# Patient Record
Sex: Female | Born: 1965
Health system: Southern US, Community
[De-identification: ages and names within clinical notes are randomized; demographics above are authoritative.]

## PROBLEM LIST (undated history)

## (undated) DIAGNOSIS — M5020 Other cervical disc displacement, unspecified cervical region: Secondary | ICD-10-CM

## (undated) DIAGNOSIS — I10 Essential (primary) hypertension: Secondary | ICD-10-CM

## (undated) DIAGNOSIS — C50919 Malignant neoplasm of unspecified site of unspecified female breast: Secondary | ICD-10-CM

## (undated) DIAGNOSIS — M797 Fibromyalgia: Secondary | ICD-10-CM

## (undated) DIAGNOSIS — C801 Malignant (primary) neoplasm, unspecified: Secondary | ICD-10-CM

## (undated) DIAGNOSIS — G4700 Insomnia, unspecified: Secondary | ICD-10-CM

## (undated) HISTORY — PX: CERVICAL DISC SURGERY: SHX588

## (undated) HISTORY — DX: Insomnia, unspecified: G47.00

## (undated) HISTORY — DX: Fibromyalgia: M79.7

## (undated) HISTORY — PX: SALPINGOOPHORECTOMY: SHX82

## (undated) HISTORY — DX: Other cervical disc displacement, unspecified cervical region: M50.20

## (undated) HISTORY — DX: Malignant neoplasm of unspecified site of unspecified female breast: C50.919

## (undated) HISTORY — DX: Essential (primary) hypertension: I10

## (undated) HISTORY — DX: Malignant (primary) neoplasm, unspecified: C80.1

## (undated) HISTORY — PX: PORT-A-CATH REMOVAL: SHX5289

---

## 1996-05-15 HISTORY — PX: KNEE ARTHROSCOPY: SUR90

## 1997-08-26 ENCOUNTER — Ambulatory Visit (HOSPITAL_COMMUNITY): Admission: RE | Admit: 1997-08-26 | Discharge: 1997-08-26 | Payer: Self-pay | Admitting: Urology

## 1998-07-22 ENCOUNTER — Ambulatory Visit (HOSPITAL_COMMUNITY): Admission: RE | Admit: 1998-07-22 | Discharge: 1998-07-22 | Payer: Self-pay | Admitting: Urology

## 1998-07-22 ENCOUNTER — Encounter: Payer: Self-pay | Admitting: Urology

## 1998-08-19 ENCOUNTER — Ambulatory Visit (HOSPITAL_COMMUNITY): Admission: RE | Admit: 1998-08-19 | Discharge: 1998-08-19 | Payer: Self-pay | Admitting: Urology

## 1998-08-19 ENCOUNTER — Encounter: Payer: Self-pay | Admitting: Urology

## 2000-10-02 ENCOUNTER — Other Ambulatory Visit: Admission: RE | Admit: 2000-10-02 | Discharge: 2000-10-02 | Payer: Self-pay | Admitting: Obstetrics and Gynecology

## 2001-12-18 ENCOUNTER — Other Ambulatory Visit: Admission: RE | Admit: 2001-12-18 | Discharge: 2001-12-18 | Payer: Self-pay | Admitting: Obstetrics and Gynecology

## 2002-05-09 ENCOUNTER — Emergency Department (HOSPITAL_COMMUNITY): Admission: EM | Admit: 2002-05-09 | Discharge: 2002-05-09 | Payer: Self-pay | Admitting: Emergency Medicine

## 2002-05-09 ENCOUNTER — Encounter: Payer: Self-pay | Admitting: Emergency Medicine

## 2002-05-16 ENCOUNTER — Ambulatory Visit (HOSPITAL_BASED_OUTPATIENT_CLINIC_OR_DEPARTMENT_OTHER): Admission: RE | Admit: 2002-05-16 | Discharge: 2002-05-16 | Payer: Self-pay | Admitting: Urology

## 2002-05-16 ENCOUNTER — Encounter: Payer: Self-pay | Admitting: Urology

## 2002-07-25 ENCOUNTER — Ambulatory Visit (HOSPITAL_COMMUNITY): Admission: RE | Admit: 2002-07-25 | Discharge: 2002-07-25 | Payer: Self-pay | Admitting: Chiropractic Medicine

## 2002-07-25 ENCOUNTER — Encounter: Payer: Self-pay | Admitting: Chiropractic Medicine

## 2003-01-02 ENCOUNTER — Encounter: Admission: RE | Admit: 2003-01-02 | Discharge: 2003-01-02 | Payer: Self-pay | Admitting: Neurosurgery

## 2003-01-02 ENCOUNTER — Encounter: Payer: Self-pay | Admitting: Neurosurgery

## 2003-01-02 ENCOUNTER — Encounter: Payer: Self-pay | Admitting: Radiology

## 2003-01-08 ENCOUNTER — Other Ambulatory Visit: Admission: RE | Admit: 2003-01-08 | Discharge: 2003-01-08 | Payer: Self-pay | Admitting: Obstetrics and Gynecology

## 2003-01-15 ENCOUNTER — Encounter: Admission: RE | Admit: 2003-01-15 | Discharge: 2003-01-15 | Payer: Self-pay | Admitting: Neurosurgery

## 2003-01-15 ENCOUNTER — Encounter: Payer: Self-pay | Admitting: Neurosurgery

## 2003-03-20 ENCOUNTER — Ambulatory Visit (HOSPITAL_COMMUNITY): Admission: RE | Admit: 2003-03-20 | Discharge: 2003-03-21 | Payer: Self-pay | Admitting: Neurosurgery

## 2004-01-19 ENCOUNTER — Other Ambulatory Visit: Admission: RE | Admit: 2004-01-19 | Discharge: 2004-01-19 | Payer: Self-pay | Admitting: Obstetrics and Gynecology

## 2004-02-12 ENCOUNTER — Ambulatory Visit (HOSPITAL_COMMUNITY): Admission: RE | Admit: 2004-02-12 | Discharge: 2004-02-12 | Payer: Self-pay | Admitting: Obstetrics and Gynecology

## 2004-09-07 ENCOUNTER — Other Ambulatory Visit: Admission: RE | Admit: 2004-09-07 | Discharge: 2004-09-07 | Payer: Self-pay | Admitting: Obstetrics and Gynecology

## 2004-10-12 ENCOUNTER — Ambulatory Visit (HOSPITAL_COMMUNITY): Admission: RE | Admit: 2004-10-12 | Discharge: 2004-10-12 | Payer: Self-pay | Admitting: Neurosurgery

## 2005-02-01 ENCOUNTER — Other Ambulatory Visit: Admission: RE | Admit: 2005-02-01 | Discharge: 2005-02-01 | Payer: Self-pay | Admitting: Obstetrics and Gynecology

## 2005-12-13 DIAGNOSIS — C50919 Malignant neoplasm of unspecified site of unspecified female breast: Secondary | ICD-10-CM

## 2005-12-13 HISTORY — DX: Malignant neoplasm of unspecified site of unspecified female breast: C50.919

## 2005-12-25 ENCOUNTER — Encounter (INDEPENDENT_AMBULATORY_CARE_PROVIDER_SITE_OTHER): Payer: Self-pay | Admitting: *Deleted

## 2005-12-25 ENCOUNTER — Encounter: Admission: RE | Admit: 2005-12-25 | Discharge: 2005-12-25 | Payer: Self-pay | Admitting: Obstetrics and Gynecology

## 2005-12-25 ENCOUNTER — Encounter (INDEPENDENT_AMBULATORY_CARE_PROVIDER_SITE_OTHER): Payer: Self-pay | Admitting: Diagnostic Radiology

## 2006-01-04 ENCOUNTER — Encounter: Admission: RE | Admit: 2006-01-04 | Discharge: 2006-01-04 | Payer: Self-pay | Admitting: Surgery

## 2006-02-01 ENCOUNTER — Encounter: Admission: RE | Admit: 2006-02-01 | Discharge: 2006-02-01 | Payer: Self-pay | Admitting: Surgery

## 2006-02-02 ENCOUNTER — Observation Stay (HOSPITAL_COMMUNITY): Admission: AD | Admit: 2006-02-02 | Discharge: 2006-02-03 | Payer: Self-pay | Admitting: Surgery

## 2006-02-02 ENCOUNTER — Encounter (INDEPENDENT_AMBULATORY_CARE_PROVIDER_SITE_OTHER): Payer: Self-pay | Admitting: *Deleted

## 2006-02-02 ENCOUNTER — Encounter: Payer: Self-pay | Admitting: Surgery

## 2006-02-02 HISTORY — PX: MASTECTOMY: SHX3

## 2006-02-15 ENCOUNTER — Ambulatory Visit: Payer: Self-pay | Admitting: Oncology

## 2006-02-19 ENCOUNTER — Inpatient Hospital Stay (HOSPITAL_COMMUNITY): Admission: AD | Admit: 2006-02-19 | Discharge: 2006-02-20 | Payer: Self-pay | Admitting: Surgery

## 2006-03-05 ENCOUNTER — Encounter: Admission: RE | Admit: 2006-03-05 | Discharge: 2006-03-05 | Payer: Self-pay | Admitting: Surgery

## 2006-03-06 ENCOUNTER — Encounter: Admission: RE | Admit: 2006-03-06 | Discharge: 2006-03-13 | Payer: Self-pay | Admitting: Surgery

## 2006-03-07 LAB — CBC WITH DIFFERENTIAL/PLATELET
Basophils Absolute: 0 10*3/uL (ref 0.0–0.1)
EOS%: 3.3 % (ref 0.0–7.0)
HCT: 37.9 % (ref 34.8–46.6)
HGB: 12.9 g/dL (ref 11.6–15.9)
LYMPH%: 24.3 % (ref 14.0–48.0)
MCH: 29 pg (ref 26.0–34.0)
MCV: 85.3 fL (ref 81.0–101.0)
NEUT%: 64.1 % (ref 39.6–76.8)
Platelets: 374 10*3/uL (ref 145–400)
lymph#: 2.3 10*3/uL (ref 0.9–3.3)

## 2006-03-07 LAB — COMPREHENSIVE METABOLIC PANEL
Albumin: 4.5 g/dL (ref 3.5–5.2)
Alkaline Phosphatase: 90 U/L (ref 39–117)
BUN: 13 mg/dL (ref 6–23)
Calcium: 9.7 mg/dL (ref 8.4–10.5)
Glucose, Bld: 83 mg/dL (ref 70–99)
Potassium: 4.7 mEq/L (ref 3.5–5.3)

## 2006-03-13 ENCOUNTER — Ambulatory Visit (HOSPITAL_COMMUNITY): Admission: RE | Admit: 2006-03-13 | Discharge: 2006-03-13 | Payer: Self-pay | Admitting: Oncology

## 2006-03-27 ENCOUNTER — Ambulatory Visit (HOSPITAL_COMMUNITY): Admission: RE | Admit: 2006-03-27 | Discharge: 2006-03-27 | Payer: Self-pay | Admitting: Surgery

## 2006-03-27 ENCOUNTER — Encounter (INDEPENDENT_AMBULATORY_CARE_PROVIDER_SITE_OTHER): Payer: Self-pay | Admitting: *Deleted

## 2006-03-28 ENCOUNTER — Ambulatory Visit (HOSPITAL_COMMUNITY): Admission: RE | Admit: 2006-03-28 | Discharge: 2006-03-28 | Payer: Self-pay | Admitting: Oncology

## 2006-04-02 ENCOUNTER — Ambulatory Visit: Payer: Self-pay | Admitting: Oncology

## 2006-04-30 ENCOUNTER — Encounter (INDEPENDENT_AMBULATORY_CARE_PROVIDER_SITE_OTHER): Payer: Self-pay | Admitting: *Deleted

## 2006-04-30 ENCOUNTER — Ambulatory Visit (HOSPITAL_COMMUNITY): Admission: RE | Admit: 2006-04-30 | Discharge: 2006-04-30 | Payer: Self-pay | Admitting: Urology

## 2006-05-16 ENCOUNTER — Ambulatory Visit: Payer: Self-pay | Admitting: Oncology

## 2006-06-06 ENCOUNTER — Ambulatory Visit: Payer: Self-pay | Admitting: Psychiatry

## 2006-06-11 ENCOUNTER — Encounter (INDEPENDENT_AMBULATORY_CARE_PROVIDER_SITE_OTHER): Payer: Self-pay | Admitting: Specialist

## 2006-06-11 ENCOUNTER — Inpatient Hospital Stay (HOSPITAL_COMMUNITY): Admission: RE | Admit: 2006-06-11 | Discharge: 2006-06-15 | Payer: Self-pay | Admitting: Urology

## 2006-06-11 HISTORY — PX: PARTIAL NEPHRECTOMY: SHX414

## 2006-06-21 LAB — CBC WITH DIFFERENTIAL/PLATELET
Eosinophils Absolute: 1 10*3/uL — ABNORMAL HIGH (ref 0.0–0.5)
MONO#: 1 10*3/uL — ABNORMAL HIGH (ref 0.1–0.9)
NEUT#: 9.4 10*3/uL — ABNORMAL HIGH (ref 1.5–6.5)
Platelets: 595 10*3/uL — ABNORMAL HIGH (ref 145–400)
RBC: 3.38 10*6/uL — ABNORMAL LOW (ref 3.70–5.32)
RDW: 14.9 % — ABNORMAL HIGH (ref 11.3–14.5)
WBC: 14.2 10*3/uL — ABNORMAL HIGH (ref 3.9–10.0)

## 2006-07-02 ENCOUNTER — Ambulatory Visit: Payer: Self-pay | Admitting: Oncology

## 2006-07-02 LAB — CBC WITH DIFFERENTIAL/PLATELET
BASO%: 1.7 % (ref 0.0–2.0)
Eosinophils Absolute: 0.5 10*3/uL (ref 0.0–0.5)
LYMPH%: 29.9 % (ref 14.0–48.0)
MCHC: 33.1 g/dL (ref 32.0–36.0)
MCV: 83.2 fL (ref 81.0–101.0)
MONO%: 9.1 % (ref 0.0–13.0)
NEUT%: 53.4 % (ref 39.6–76.8)
Platelets: 501 10*3/uL — ABNORMAL HIGH (ref 145–400)
RBC: 3.88 10*6/uL (ref 3.70–5.32)

## 2006-07-02 LAB — COMPREHENSIVE METABOLIC PANEL
ALT: 19 U/L (ref 0–35)
Alkaline Phosphatase: 103 U/L (ref 39–117)
Sodium: 140 mEq/L (ref 135–145)
Total Bilirubin: 0.4 mg/dL (ref 0.3–1.2)
Total Protein: 7.5 g/dL (ref 6.0–8.3)

## 2006-07-09 LAB — CBC WITH DIFFERENTIAL/PLATELET
BASO%: 2 % (ref 0.0–2.0)
EOS%: 10.2 % — ABNORMAL HIGH (ref 0.0–7.0)
LYMPH%: 45.1 % (ref 14.0–48.0)
MCH: 27.6 pg (ref 26.0–34.0)
MCHC: 33.5 g/dL (ref 32.0–36.0)
MONO#: 0.1 10*3/uL (ref 0.1–0.9)
MONO%: 3.6 % (ref 0.0–13.0)
Platelets: 249 10*3/uL (ref 145–400)
RBC: 4.02 10*6/uL (ref 3.70–5.32)
WBC: 3.4 10*3/uL — ABNORMAL LOW (ref 3.9–10.0)

## 2006-07-23 LAB — CBC WITH DIFFERENTIAL/PLATELET
BASO%: 2.8 % — ABNORMAL HIGH (ref 0.0–2.0)
EOS%: 0.4 % (ref 0.0–7.0)
HCT: 33.9 % — ABNORMAL LOW (ref 34.8–46.6)
MCH: 27.1 pg (ref 26.0–34.0)
MCHC: 32.6 g/dL (ref 32.0–36.0)
MONO#: 1 10*3/uL — ABNORMAL HIGH (ref 0.1–0.9)
RBC: 4.08 10*6/uL (ref 3.70–5.32)
RDW: 13.7 % (ref 11.3–14.5)
WBC: 9.1 10*3/uL (ref 3.9–10.0)
lymph#: 2.1 10*3/uL (ref 0.9–3.3)

## 2006-07-30 LAB — CBC WITH DIFFERENTIAL/PLATELET
Basophils Absolute: 0.1 10*3/uL (ref 0.0–0.1)
EOS%: 1.1 % (ref 0.0–7.0)
HCT: 35.1 % (ref 34.8–46.6)
HGB: 11.9 g/dL (ref 11.6–15.9)
MONO#: 0.1 10*3/uL (ref 0.1–0.9)
NEUT#: 3.2 10*3/uL (ref 1.5–6.5)
NEUT%: 65.8 % (ref 39.6–76.8)
RDW: 15.2 % — ABNORMAL HIGH (ref 11.3–14.5)
WBC: 4.9 10*3/uL (ref 3.9–10.0)
lymph#: 1.4 10*3/uL (ref 0.9–3.3)

## 2006-07-30 LAB — COMPREHENSIVE METABOLIC PANEL
ALT: 22 U/L (ref 0–35)
AST: 15 U/L (ref 0–37)
Albumin: 4.6 g/dL (ref 3.5–5.2)
BUN: 11 mg/dL (ref 6–23)
CO2: 23 mEq/L (ref 19–32)
Calcium: 9.9 mg/dL (ref 8.4–10.5)
Chloride: 103 mEq/L (ref 96–112)
Creatinine, Ser: 0.71 mg/dL (ref 0.40–1.20)
Potassium: 4.2 mEq/L (ref 3.5–5.3)

## 2006-08-13 LAB — CBC WITH DIFFERENTIAL/PLATELET
BASO%: 1.4 % (ref 0.0–2.0)
EOS%: 0.6 % (ref 0.0–7.0)
HCT: 34.4 % — ABNORMAL LOW (ref 34.8–46.6)
LYMPH%: 19.9 % (ref 14.0–48.0)
MCH: 27.2 pg (ref 26.0–34.0)
MCHC: 33.2 g/dL (ref 32.0–36.0)
MCV: 81.7 fL (ref 81.0–101.0)
MONO#: 0.9 10*3/uL (ref 0.1–0.9)
MONO%: 10.9 % (ref 0.0–13.0)
NEUT%: 67.2 % (ref 39.6–76.8)
Platelets: 371 10*3/uL (ref 145–400)
RBC: 4.21 10*6/uL (ref 3.70–5.32)
WBC: 8.5 10*3/uL (ref 3.9–10.0)

## 2006-08-16 ENCOUNTER — Ambulatory Visit: Payer: Self-pay | Admitting: Oncology

## 2006-08-20 LAB — CBC WITH DIFFERENTIAL/PLATELET
BASO%: 0.9 % (ref 0.0–2.0)
EOS%: 1.5 % (ref 0.0–7.0)
MCH: 27.4 pg (ref 26.0–34.0)
MCHC: 34.2 g/dL (ref 32.0–36.0)
MONO#: 0.2 10*3/uL (ref 0.1–0.9)
NEUT%: 66.8 % (ref 39.6–76.8)
RBC: 4.08 10*6/uL (ref 3.70–5.32)
RDW: 16.1 % — ABNORMAL HIGH (ref 11.3–14.5)
WBC: 5.1 10*3/uL (ref 3.9–10.0)
lymph#: 1.3 10*3/uL (ref 0.9–3.3)

## 2006-08-20 LAB — COMPREHENSIVE METABOLIC PANEL
ALT: 23 U/L (ref 0–35)
AST: 19 U/L (ref 0–37)
CO2: 24 mEq/L (ref 19–32)
Calcium: 9.7 mg/dL (ref 8.4–10.5)
Chloride: 105 mEq/L (ref 96–112)
Creatinine, Ser: 0.65 mg/dL (ref 0.40–1.20)
Sodium: 140 mEq/L (ref 135–145)
Total Protein: 7.3 g/dL (ref 6.0–8.3)

## 2006-09-03 LAB — COMPREHENSIVE METABOLIC PANEL
ALT: 30 U/L (ref 0–35)
AST: 19 U/L (ref 0–37)
Alkaline Phosphatase: 96 U/L (ref 39–117)
Creatinine, Ser: 0.77 mg/dL (ref 0.40–1.20)
Sodium: 137 mEq/L (ref 135–145)
Total Bilirubin: 0.4 mg/dL (ref 0.3–1.2)
Total Protein: 7.5 g/dL (ref 6.0–8.3)

## 2006-09-03 LAB — CBC WITH DIFFERENTIAL/PLATELET
BASO%: 0.3 % (ref 0.0–2.0)
EOS%: 0 % (ref 0.0–7.0)
HCT: 34.1 % — ABNORMAL LOW (ref 34.8–46.6)
LYMPH%: 6.7 % — ABNORMAL LOW (ref 14.0–48.0)
MCH: 27.5 pg (ref 26.0–34.0)
MCHC: 33.6 g/dL (ref 32.0–36.0)
NEUT%: 89.2 % — ABNORMAL HIGH (ref 39.6–76.8)
Platelets: 513 10*3/uL — ABNORMAL HIGH (ref 145–400)
RBC: 4.17 10*6/uL (ref 3.70–5.32)
WBC: 17 10*3/uL — ABNORMAL HIGH (ref 3.9–10.0)

## 2006-09-10 LAB — CBC WITH DIFFERENTIAL/PLATELET
BASO%: 2.5 % — ABNORMAL HIGH (ref 0.0–2.0)
Basophils Absolute: 0.6 10*3/uL — ABNORMAL HIGH (ref 0.0–0.1)
EOS%: 0.3 % (ref 0.0–7.0)
HCT: 36.5 % (ref 34.8–46.6)
HGB: 12.5 g/dL (ref 11.6–15.9)
LYMPH%: 12.3 % — ABNORMAL LOW (ref 14.0–48.0)
MCH: 27.6 pg (ref 26.0–34.0)
MCHC: 34.3 g/dL (ref 32.0–36.0)
NEUT%: 77.1 % — ABNORMAL HIGH (ref 39.6–76.8)
Platelets: 232 10*3/uL (ref 145–400)

## 2006-09-24 LAB — CBC WITH DIFFERENTIAL/PLATELET
BASO%: 0.3 % (ref 0.0–2.0)
Basophils Absolute: 0 10*3/uL (ref 0.0–0.1)
EOS%: 0 % (ref 0.0–7.0)
HCT: 34.3 % — ABNORMAL LOW (ref 34.8–46.6)
MCH: 27.6 pg (ref 26.0–34.0)
MCHC: 33.3 g/dL (ref 32.0–36.0)
MCV: 82.8 fL (ref 81.0–101.0)
MONO%: 4.7 % (ref 0.0–13.0)
NEUT%: 87 % — ABNORMAL HIGH (ref 39.6–76.8)
RDW: 17.1 % — ABNORMAL HIGH (ref 11.3–14.5)
lymph#: 0.9 10*3/uL (ref 0.9–3.3)

## 2006-09-24 LAB — COMPREHENSIVE METABOLIC PANEL
ALT: 21 U/L (ref 0–35)
AST: 15 U/L (ref 0–37)
Alkaline Phosphatase: 101 U/L (ref 39–117)
BUN: 10 mg/dL (ref 6–23)
Calcium: 9.8 mg/dL (ref 8.4–10.5)
Chloride: 106 mEq/L (ref 96–112)
Creatinine, Ser: 0.65 mg/dL (ref 0.40–1.20)

## 2006-10-01 ENCOUNTER — Ambulatory Visit: Payer: Self-pay | Admitting: Oncology

## 2006-10-01 LAB — COMPREHENSIVE METABOLIC PANEL
ALT: 51 U/L — ABNORMAL HIGH (ref 0–35)
AST: 30 U/L (ref 0–37)
BUN: 10 mg/dL (ref 6–23)
Calcium: 9.2 mg/dL (ref 8.4–10.5)
Chloride: 101 mEq/L (ref 96–112)
Creatinine, Ser: 0.66 mg/dL (ref 0.40–1.20)
Total Bilirubin: 0.7 mg/dL (ref 0.3–1.2)

## 2006-10-01 LAB — CBC WITH DIFFERENTIAL/PLATELET
BASO%: 0.7 % (ref 0.0–2.0)
Basophils Absolute: 0 10*3/uL (ref 0.0–0.1)
EOS%: 8.5 % — ABNORMAL HIGH (ref 0.0–7.0)
HCT: 33.8 % — ABNORMAL LOW (ref 34.8–46.6)
HGB: 11.9 g/dL (ref 11.6–15.9)
LYMPH%: 53.5 % — ABNORMAL HIGH (ref 14.0–48.0)
MCH: 28.3 pg (ref 26.0–34.0)
MCHC: 35.3 g/dL (ref 32.0–36.0)
MCV: 80.3 fL — ABNORMAL LOW (ref 81.0–101.0)
MONO%: 4 % (ref 0.0–13.0)
NEUT%: 33.3 % — ABNORMAL LOW (ref 39.6–76.8)
Platelets: 300 10*3/uL (ref 145–400)
lymph#: 0.6 10*3/uL — ABNORMAL LOW (ref 0.9–3.3)

## 2006-10-15 ENCOUNTER — Ambulatory Visit: Admission: RE | Admit: 2006-10-15 | Discharge: 2007-01-03 | Payer: Self-pay | Admitting: *Deleted

## 2006-10-15 LAB — CBC WITH DIFFERENTIAL/PLATELET
Basophils Absolute: 0 10*3/uL (ref 0.0–0.1)
EOS%: 0 % (ref 0.0–7.0)
HCT: 33.1 % — ABNORMAL LOW (ref 34.8–46.6)
HGB: 11.1 g/dL — ABNORMAL LOW (ref 11.6–15.9)
LYMPH%: 11.5 % — ABNORMAL LOW (ref 14.0–48.0)
MCH: 27.6 pg (ref 26.0–34.0)
MCHC: 33.5 g/dL (ref 32.0–36.0)
MONO#: 1 10*3/uL — ABNORMAL HIGH (ref 0.1–0.9)
NEUT%: 80.7 % — ABNORMAL HIGH (ref 39.6–76.8)
Platelets: 281 10*3/uL (ref 145–400)
lymph#: 1.6 10*3/uL (ref 0.9–3.3)

## 2006-10-15 LAB — COMPREHENSIVE METABOLIC PANEL
ALT: 28 U/L (ref 0–35)
AST: 20 U/L (ref 0–37)
BUN: 13 mg/dL (ref 6–23)
Creatinine, Ser: 0.67 mg/dL (ref 0.40–1.20)
Total Bilirubin: 0.5 mg/dL (ref 0.3–1.2)

## 2006-10-30 LAB — COMPREHENSIVE METABOLIC PANEL
ALT: 24 U/L (ref 0–35)
BUN: 6 mg/dL (ref 6–23)
CO2: 21 mEq/L (ref 19–32)
Calcium: 9.2 mg/dL (ref 8.4–10.5)
Chloride: 106 mEq/L (ref 96–112)
Creatinine, Ser: 0.73 mg/dL (ref 0.40–1.20)
Total Bilirubin: 0.5 mg/dL (ref 0.3–1.2)

## 2006-10-30 LAB — CBC WITH DIFFERENTIAL/PLATELET
BASO%: 3 % — ABNORMAL HIGH (ref 0.0–2.0)
Basophils Absolute: 0.2 10*3/uL — ABNORMAL HIGH (ref 0.0–0.1)
EOS%: 0.2 % (ref 0.0–7.0)
HCT: 33.5 % — ABNORMAL LOW (ref 34.8–46.6)
HGB: 11.4 g/dL — ABNORMAL LOW (ref 11.6–15.9)
LYMPH%: 22.4 % (ref 14.0–48.0)
MCH: 27.8 pg (ref 26.0–34.0)
MCHC: 34.1 g/dL (ref 32.0–36.0)
MONO#: 1.2 10*3/uL — ABNORMAL HIGH (ref 0.1–0.9)
NEUT%: 55.5 % (ref 39.6–76.8)
Platelets: 339 10*3/uL (ref 145–400)

## 2006-11-20 ENCOUNTER — Ambulatory Visit: Payer: Self-pay | Admitting: Oncology

## 2006-12-14 ENCOUNTER — Encounter: Admission: RE | Admit: 2006-12-14 | Discharge: 2006-12-14 | Payer: Self-pay | Admitting: Oncology

## 2006-12-14 LAB — CBC WITH DIFFERENTIAL/PLATELET
Basophils Absolute: 0 10*3/uL (ref 0.0–0.1)
Eosinophils Absolute: 0.2 10*3/uL (ref 0.0–0.5)
HCT: 34.3 % — ABNORMAL LOW (ref 34.8–46.6)
HGB: 11.9 g/dL (ref 11.6–15.9)
LYMPH%: 13.5 % — ABNORMAL LOW (ref 14.0–48.0)
MCHC: 34.7 g/dL (ref 32.0–36.0)
MONO#: 0.6 10*3/uL (ref 0.1–0.9)
NEUT#: 2.6 10*3/uL (ref 1.5–6.5)
NEUT%: 64.5 % (ref 39.6–76.8)
Platelets: 252 10*3/uL (ref 145–400)
WBC: 4 10*3/uL (ref 3.9–10.0)
lymph#: 0.5 10*3/uL — ABNORMAL LOW (ref 0.9–3.3)

## 2006-12-20 LAB — LACTATE DEHYDROGENASE: LDH: 173 U/L (ref 94–250)

## 2006-12-20 LAB — COMPREHENSIVE METABOLIC PANEL
ALT: 22 U/L (ref 0–35)
BUN: 6 mg/dL (ref 6–23)
CO2: 20 mEq/L (ref 19–32)
Calcium: 9.1 mg/dL (ref 8.4–10.5)
Chloride: 107 mEq/L (ref 96–112)
Creatinine, Ser: 0.7 mg/dL (ref 0.40–1.20)
Glucose, Bld: 124 mg/dL — ABNORMAL HIGH (ref 70–99)
Total Bilirubin: 0.5 mg/dL (ref 0.3–1.2)

## 2006-12-20 LAB — VITAMIN D PNL(25-HYDRXY+1,25-DIHY)-BLD
Vit D, 1,25-Dihydroxy: 42 pg/mL (ref 6–62)
Vit D, 25-Hydroxy: 5 ng/mL — ABNORMAL LOW (ref 20–57)

## 2006-12-20 LAB — CANCER ANTIGEN 27.29: CA 27.29: 8 U/mL (ref 0–39)

## 2006-12-31 ENCOUNTER — Encounter: Admission: RE | Admit: 2006-12-31 | Discharge: 2006-12-31 | Payer: Self-pay | Admitting: Oncology

## 2007-01-25 ENCOUNTER — Ambulatory Visit (HOSPITAL_BASED_OUTPATIENT_CLINIC_OR_DEPARTMENT_OTHER): Admission: RE | Admit: 2007-01-25 | Discharge: 2007-01-25 | Payer: Self-pay | Admitting: Surgery

## 2007-01-30 ENCOUNTER — Ambulatory Visit: Payer: Self-pay | Admitting: Oncology

## 2007-02-01 LAB — CBC WITH DIFFERENTIAL/PLATELET
BASO%: 0.5 % (ref 0.0–2.0)
EOS%: 3.3 % (ref 0.0–7.0)
HCT: 36.5 % (ref 34.8–46.6)
LYMPH%: 25.3 % (ref 14.0–48.0)
MCH: 27.5 pg (ref 26.0–34.0)
MCHC: 34.8 g/dL (ref 32.0–36.0)
NEUT%: 60.6 % (ref 39.6–76.8)
Platelets: 284 10*3/uL (ref 145–400)
RBC: 4.64 10*6/uL (ref 3.70–5.32)
lymph#: 1.9 10*3/uL (ref 0.9–3.3)

## 2007-02-01 LAB — COMPREHENSIVE METABOLIC PANEL
ALT: 17 U/L (ref 0–35)
AST: 17 U/L (ref 0–37)
Creatinine, Ser: 0.66 mg/dL (ref 0.40–1.20)
Sodium: 141 mEq/L (ref 135–145)
Total Bilirubin: 0.4 mg/dL (ref 0.3–1.2)

## 2007-02-13 DIAGNOSIS — C801 Malignant (primary) neoplasm, unspecified: Secondary | ICD-10-CM

## 2007-02-13 HISTORY — DX: Malignant (primary) neoplasm, unspecified: C80.1

## 2007-03-28 IMAGING — CT CT BIOPSY
1 of 5 series · 14 of 32 positions shown, 19 images · non-contrast
Comparison: none

CLINICAL DATA: Breast cancer, enhancing renal lesion in the lower left pole. 
 CT GUIDED LEFT RENAL LESION BIOPSY, FINE NEEDLE ASPIRATION AND CORE ? 04/30/06:
 Procedure: In the prone position, the left back was prepped and draped in a sterile fashion and lidocaine was utilized for local anesthesia.  Under CT guidance, a 19-gauge guide needle was inserted into the lesion in the lower pole of the left kidney.  Three 22-gauge fine needle aspirates were obtained.   Preliminary diagnosis was inconclusive; therefore, 3 20-gauge core biopsies were obtained.  
 Post biopsy images demonstrate a small perinephric hematoma.

[Series 2: abd_pel 5.0 b40f st · axial · 0.72mm/px · z∈[-208,-23]mm · 14 of 43 slices shown, 19 images]
[im 3/43  soft-tissue]
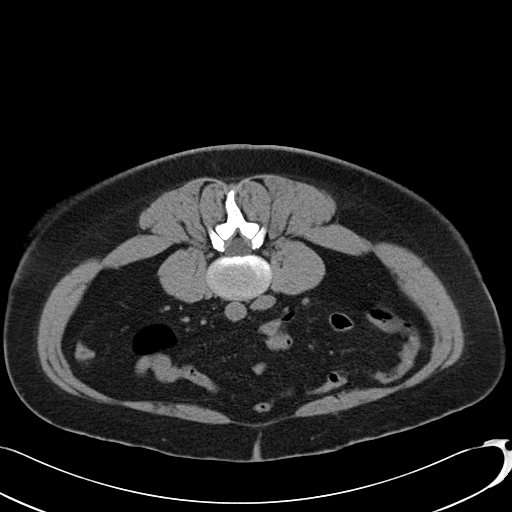
[im 3/43  bone]
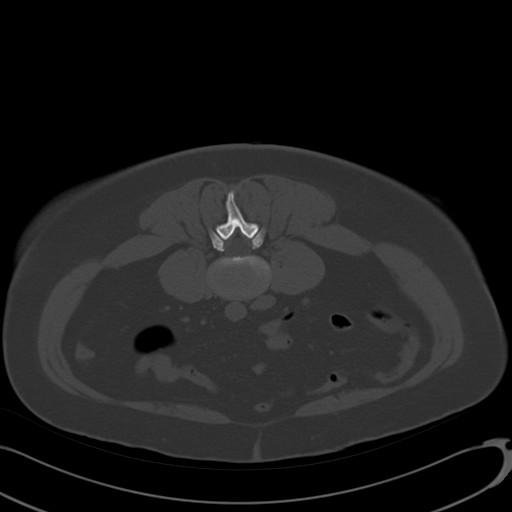
[im 6/43  soft-tissue]
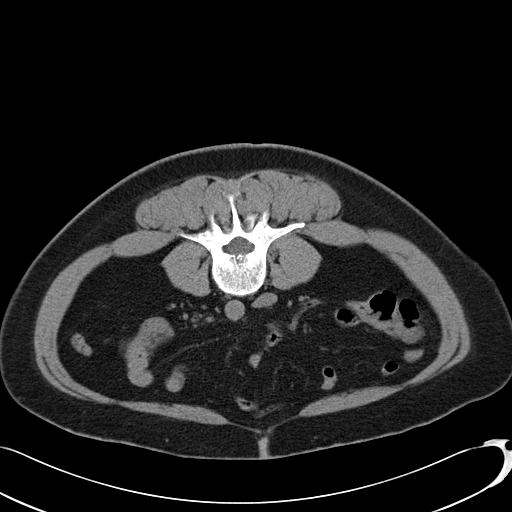
[im 8/43  soft-tissue]
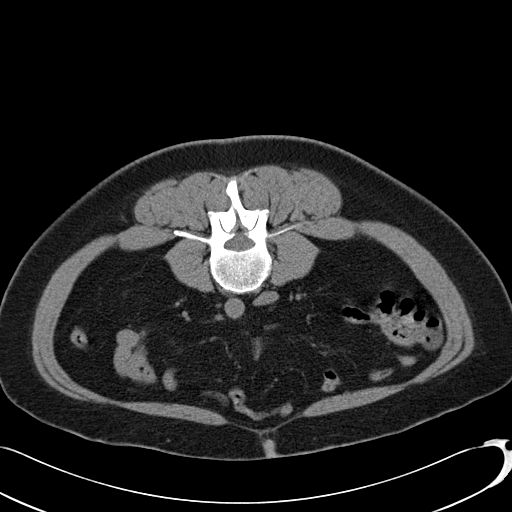
[im 14/43  soft-tissue]
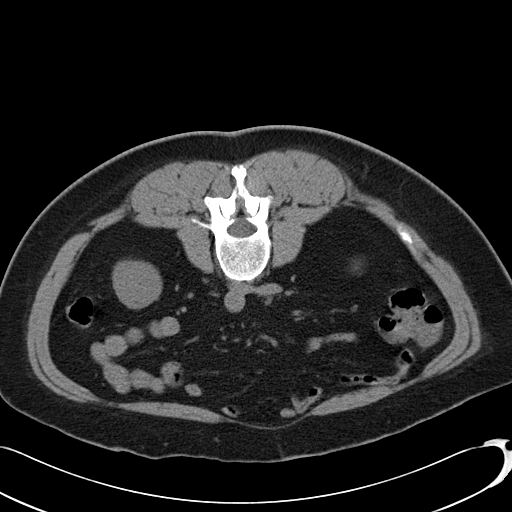
[im 16/43  soft-tissue]
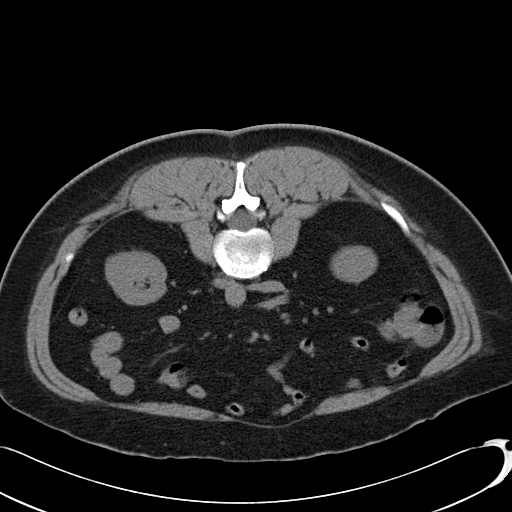
[im 19/43  soft-tissue]
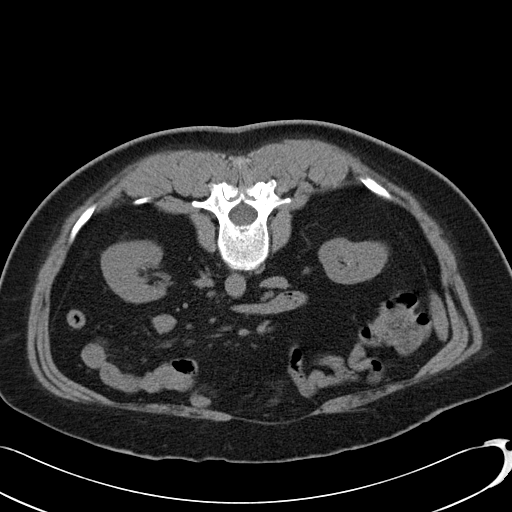
[im 22/43  soft-tissue]
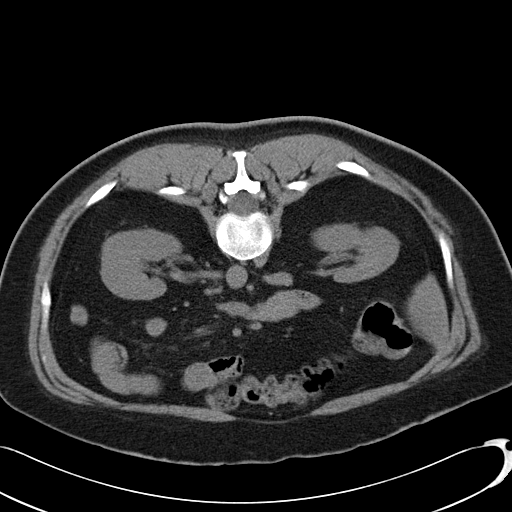
[im 24/43  soft-tissue]
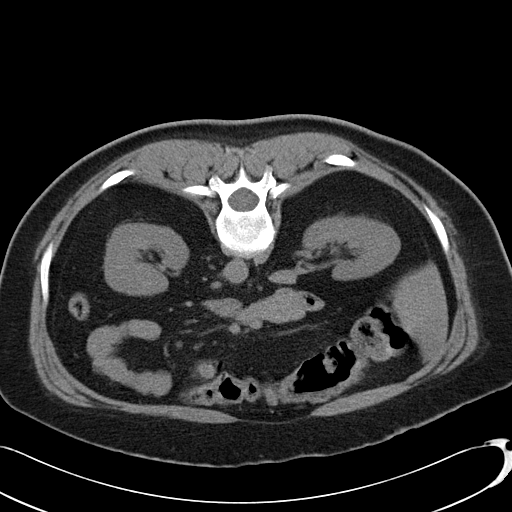
[im 27/43  soft-tissue]
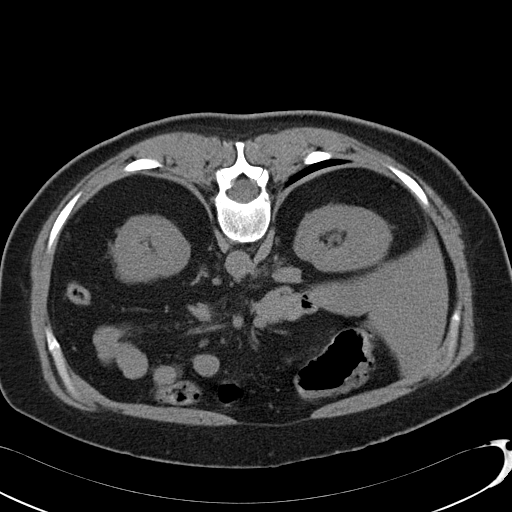
[im 27/43  bone]
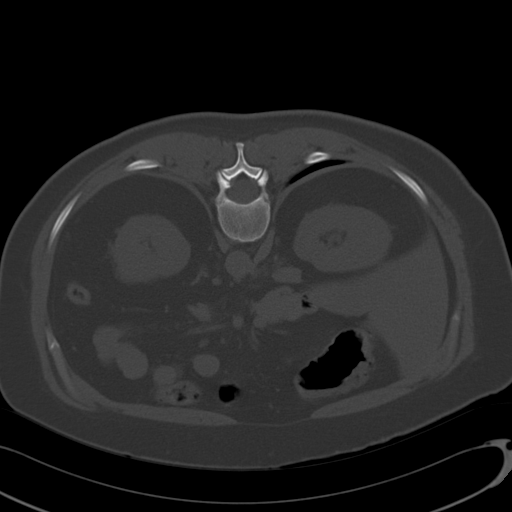
[im 29/43  soft-tissue]
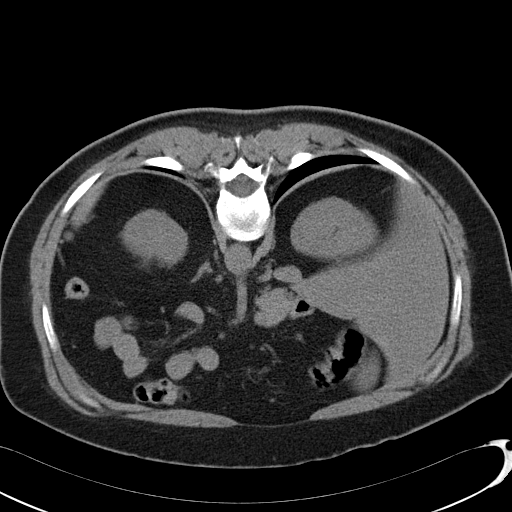
[im 32/43  lung]
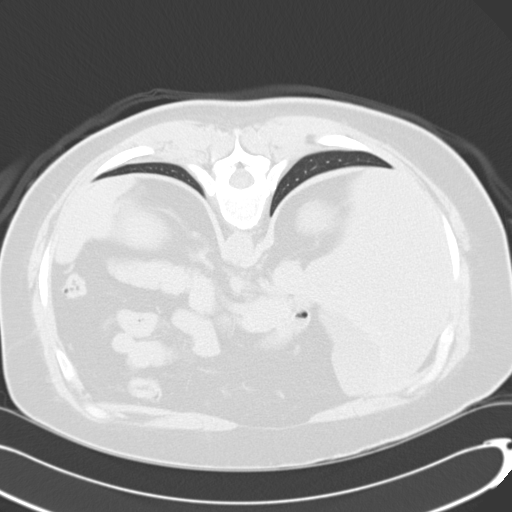
[im 35/43  soft-tissue]
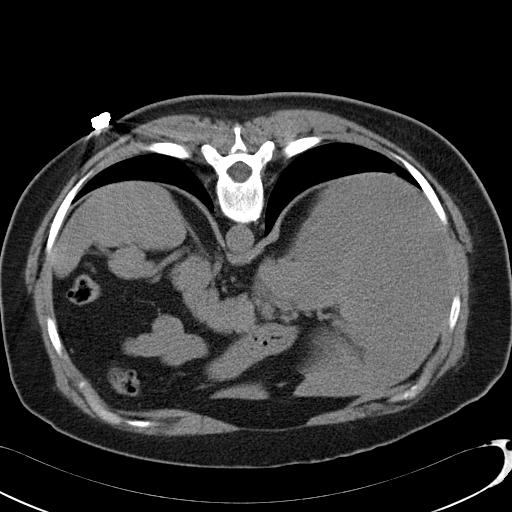
[im 35/43  lung]
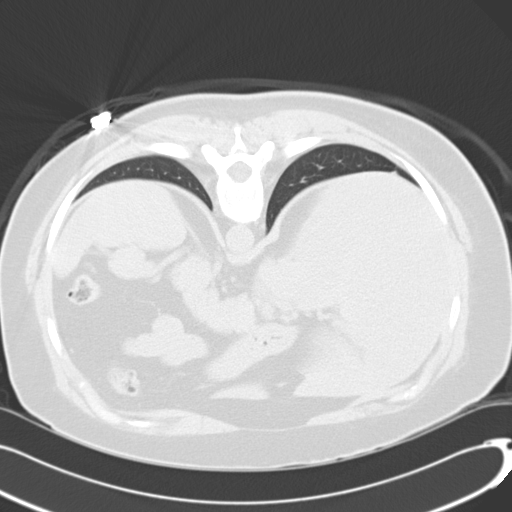
[im 37/43  soft-tissue]
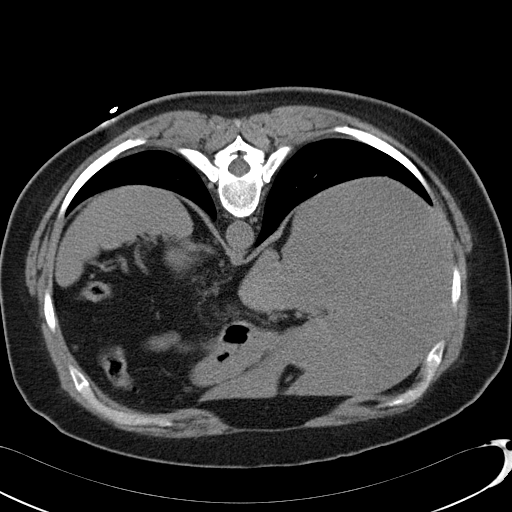
[im 37/43  lung]
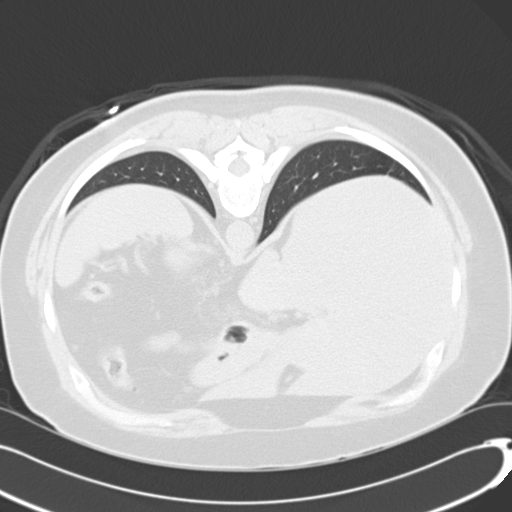
[im 40/43  soft-tissue]
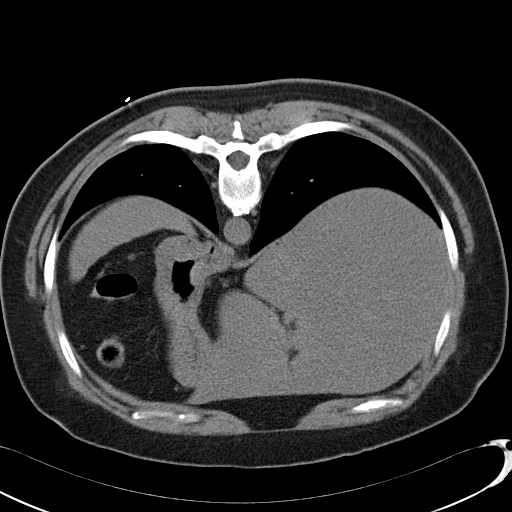
[im 40/43  lung]
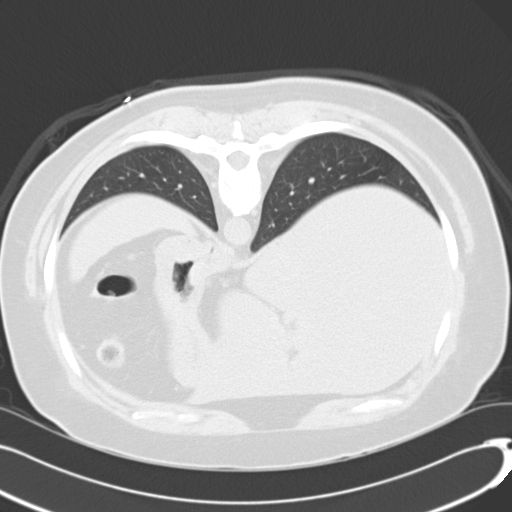

[14 of 32 positions shown; findings below may reference images not displayed]

FINDINGS: Images document needle placement in the left renal lesion.  Post biopsy images demonstrate a small amount of perinephric hematoma.
IMPRESSION: Successful fine needle aspiration and core biopsy of a lesion in the lower pole of the left kidney.  A small perinephric hematoma resulted. The patient remains hemodynamically stable throughout the procedure.

## 2007-04-29 ENCOUNTER — Ambulatory Visit: Payer: Self-pay | Admitting: Oncology

## 2007-05-06 LAB — CBC WITH DIFFERENTIAL/PLATELET
BASO%: 0.3 % (ref 0.0–2.0)
EOS%: 1.9 % (ref 0.0–7.0)
HCT: 36.7 % (ref 34.8–46.6)
LYMPH%: 28.1 % (ref 14.0–48.0)
MCH: 28 pg (ref 26.0–34.0)
MCHC: 34.5 g/dL (ref 32.0–36.0)
MCV: 81.3 fL (ref 81.0–101.0)
MONO%: 7.4 % (ref 0.0–13.0)
NEUT%: 62.3 % (ref 39.6–76.8)
Platelets: 299 10*3/uL (ref 145–400)

## 2007-05-06 LAB — COMPREHENSIVE METABOLIC PANEL
AST: 16 U/L (ref 0–37)
Albumin: 4.6 g/dL (ref 3.5–5.2)
Alkaline Phosphatase: 139 U/L — ABNORMAL HIGH (ref 39–117)
BUN: 12 mg/dL (ref 6–23)
Creatinine, Ser: 0.78 mg/dL (ref 0.40–1.20)
Potassium: 4.2 mEq/L (ref 3.5–5.3)
Total Bilirubin: 0.5 mg/dL (ref 0.3–1.2)

## 2007-05-06 LAB — CANCER ANTIGEN 27.29: CA 27.29: 15 U/mL (ref 0–39)

## 2007-09-02 ENCOUNTER — Ambulatory Visit: Payer: Self-pay | Admitting: Oncology

## 2007-09-04 LAB — CBC WITH DIFFERENTIAL/PLATELET
BASO%: 0.4 % (ref 0.0–2.0)
EOS%: 2.3 % (ref 0.0–7.0)
HCT: 37.5 % (ref 34.8–46.6)
MCH: 29 pg (ref 26.0–34.0)
MCHC: 34.8 g/dL (ref 32.0–36.0)
MONO#: 0.7 10*3/uL (ref 0.1–0.9)
NEUT%: 65.4 % (ref 39.6–76.8)
RBC: 4.49 10*6/uL (ref 3.70–5.32)
RDW: 14.5 % (ref 11.3–14.5)
WBC: 8.5 10*3/uL (ref 3.9–10.0)
lymph#: 2.1 10*3/uL (ref 0.9–3.3)

## 2007-09-05 LAB — COMPREHENSIVE METABOLIC PANEL
ALT: 13 U/L (ref 0–35)
AST: 16 U/L (ref 0–37)
Albumin: 4.7 g/dL (ref 3.5–5.2)
CO2: 22 mEq/L (ref 19–32)
Calcium: 9.9 mg/dL (ref 8.4–10.5)
Chloride: 105 mEq/L (ref 96–112)
Creatinine, Ser: 0.83 mg/dL (ref 0.40–1.20)
Potassium: 4.2 mEq/L (ref 3.5–5.3)
Sodium: 141 mEq/L (ref 135–145)
Total Protein: 7.9 g/dL (ref 6.0–8.3)

## 2007-09-05 LAB — LACTATE DEHYDROGENASE: LDH: 211 U/L (ref 94–250)

## 2007-09-12 LAB — VITAMIN D 1,25 DIHYDROXY: Vit D, 1,25-Dihydroxy: 32 pg/mL (ref 15–75)

## 2008-01-01 ENCOUNTER — Encounter: Admission: RE | Admit: 2008-01-01 | Discharge: 2008-01-01 | Payer: Self-pay | Admitting: Oncology

## 2008-01-21 ENCOUNTER — Ambulatory Visit (HOSPITAL_COMMUNITY): Admission: RE | Admit: 2008-01-21 | Discharge: 2008-01-21 | Payer: Self-pay | Admitting: Oncology

## 2008-03-06 ENCOUNTER — Ambulatory Visit: Payer: Self-pay | Admitting: Oncology

## 2008-03-10 LAB — CBC WITH DIFFERENTIAL/PLATELET
Basophils Absolute: 0 10*3/uL (ref 0.0–0.1)
Eosinophils Absolute: 0.1 10*3/uL (ref 0.0–0.5)
HCT: 39.5 % (ref 34.8–46.6)
HGB: 13.6 g/dL (ref 11.6–15.9)
LYMPH%: 24 % (ref 14.0–48.0)
MONO#: 0.7 10*3/uL (ref 0.1–0.9)
NEUT#: 5.7 10*3/uL (ref 1.5–6.5)
Platelets: 267 10*3/uL (ref 145–400)
RBC: 4.63 10*6/uL (ref 3.70–5.32)
WBC: 8.6 10*3/uL (ref 3.9–10.0)

## 2008-03-11 LAB — COMPREHENSIVE METABOLIC PANEL
Albumin: 5.1 g/dL (ref 3.5–5.2)
BUN: 13 mg/dL (ref 6–23)
CO2: 24 mEq/L (ref 19–32)
Glucose, Bld: 82 mg/dL (ref 70–99)
Potassium: 3.9 mEq/L (ref 3.5–5.3)
Sodium: 142 mEq/L (ref 135–145)
Total Bilirubin: 0.6 mg/dL (ref 0.3–1.2)
Total Protein: 8.2 g/dL (ref 6.0–8.3)

## 2008-03-11 LAB — VITAMIN D 25 HYDROXY (VIT D DEFICIENCY, FRACTURES): Vit D, 25-Hydroxy: 22 ng/mL — ABNORMAL LOW (ref 30–89)

## 2008-03-11 LAB — LACTATE DEHYDROGENASE: LDH: 215 U/L (ref 94–250)

## 2008-03-11 LAB — CANCER ANTIGEN 27.29: CA 27.29: 15 U/mL (ref 0–39)

## 2008-03-17 ENCOUNTER — Ambulatory Visit (HOSPITAL_COMMUNITY): Admission: RE | Admit: 2008-03-17 | Discharge: 2008-03-17 | Payer: Self-pay | Admitting: Oncology

## 2008-03-26 LAB — ESTRADIOL, ULTRA SENS

## 2008-09-23 ENCOUNTER — Ambulatory Visit: Payer: Self-pay | Admitting: Oncology

## 2008-09-28 LAB — CBC WITH DIFFERENTIAL/PLATELET
BASO%: 0.5 % (ref 0.0–2.0)
EOS%: 1.4 % (ref 0.0–7.0)
HCT: 38.7 % (ref 34.8–46.6)
LYMPH%: 25.5 % (ref 14.0–49.7)
MCH: 29.2 pg (ref 25.1–34.0)
MCHC: 33.8 g/dL (ref 31.5–36.0)
NEUT%: 66.2 % (ref 38.4–76.8)
lymph#: 2.4 10*3/uL (ref 0.9–3.3)

## 2008-09-29 LAB — COMPREHENSIVE METABOLIC PANEL
ALT: 16 U/L (ref 0–35)
AST: 17 U/L (ref 0–37)
Alkaline Phosphatase: 125 U/L — ABNORMAL HIGH (ref 39–117)
Creatinine, Ser: 0.83 mg/dL (ref 0.40–1.20)
Sodium: 140 mEq/L (ref 135–145)
Total Bilirubin: 0.4 mg/dL (ref 0.3–1.2)
Total Protein: 7.9 g/dL (ref 6.0–8.3)

## 2008-09-29 LAB — LACTATE DEHYDROGENASE: LDH: 181 U/L (ref 94–250)

## 2009-01-01 ENCOUNTER — Encounter: Admission: RE | Admit: 2009-01-01 | Discharge: 2009-01-01 | Payer: Self-pay | Admitting: Oncology

## 2009-02-19 ENCOUNTER — Ambulatory Visit: Payer: Self-pay | Admitting: Oncology

## 2009-02-23 LAB — CBC WITH DIFFERENTIAL/PLATELET
BASO%: 0.7 % (ref 0.0–2.0)
EOS%: 1.4 % (ref 0.0–7.0)
LYMPH%: 26.3 % (ref 14.0–49.7)
MCHC: 34.4 g/dL (ref 31.5–36.0)
MCV: 86.2 fL (ref 79.5–101.0)
MONO%: 9 % (ref 0.0–14.0)
NEUT#: 5.2 10*3/uL (ref 1.5–6.5)
Platelets: 273 10*3/uL (ref 145–400)
RBC: 4.27 10*6/uL (ref 3.70–5.45)
RDW: 13.7 % (ref 11.2–14.5)

## 2009-02-23 LAB — COMPREHENSIVE METABOLIC PANEL
AST: 22 U/L (ref 0–37)
Albumin: 4 g/dL (ref 3.5–5.2)
BUN: 12 mg/dL (ref 6–23)
CO2: 25 mEq/L (ref 19–32)
Calcium: 9.3 mg/dL (ref 8.4–10.5)
Chloride: 108 mEq/L (ref 96–112)
Glucose, Bld: 95 mg/dL (ref 70–99)
Potassium: 3.4 mEq/L — ABNORMAL LOW (ref 3.5–5.3)

## 2009-02-23 LAB — LACTATE DEHYDROGENASE: LDH: 169 U/L (ref 94–250)

## 2009-02-24 LAB — VITAMIN D 25 HYDROXY (VIT D DEFICIENCY, FRACTURES): Vit D, 25-Hydroxy: 24 ng/mL — ABNORMAL LOW (ref 30–89)

## 2009-03-19 ENCOUNTER — Encounter: Admission: RE | Admit: 2009-03-19 | Discharge: 2009-03-19 | Payer: Self-pay | Admitting: Oncology

## 2009-08-30 ENCOUNTER — Ambulatory Visit: Payer: Self-pay | Admitting: Oncology

## 2009-09-01 LAB — CBC WITH DIFFERENTIAL/PLATELET
Basophils Absolute: 0.1 10*3/uL (ref 0.0–0.1)
EOS%: 1.7 % (ref 0.0–7.0)
HGB: 13.6 g/dL (ref 11.6–15.9)
LYMPH%: 26.8 % (ref 14.0–49.7)
MCH: 29.6 pg (ref 25.1–34.0)
MCV: 87.1 fL (ref 79.5–101.0)
MONO%: 8.7 % (ref 0.0–14.0)
NEUT%: 62.2 % (ref 38.4–76.8)
Platelets: 303 10*3/uL (ref 145–400)
RDW: 14.1 % (ref 11.2–14.5)

## 2009-09-01 LAB — COMPREHENSIVE METABOLIC PANEL
AST: 27 U/L (ref 0–37)
Alkaline Phosphatase: 101 U/L (ref 39–117)
BUN: 9 mg/dL (ref 6–23)
Creatinine, Ser: 1.17 mg/dL (ref 0.40–1.20)
Glucose, Bld: 100 mg/dL — ABNORMAL HIGH (ref 70–99)
Potassium: 4 mEq/L (ref 3.5–5.3)
Total Bilirubin: 0.6 mg/dL (ref 0.3–1.2)

## 2009-09-02 LAB — VITAMIN D 25 HYDROXY (VIT D DEFICIENCY, FRACTURES): Vit D, 25-Hydroxy: 28 ng/mL — ABNORMAL LOW (ref 30–89)

## 2009-09-02 LAB — CANCER ANTIGEN 27.29: CA 27.29: 16 U/mL (ref 0–39)

## 2009-12-23 ENCOUNTER — Encounter: Admission: RE | Admit: 2009-12-23 | Discharge: 2009-12-23 | Payer: Self-pay | Admitting: Oncology

## 2010-03-04 ENCOUNTER — Ambulatory Visit: Payer: Self-pay | Admitting: Oncology

## 2010-03-14 LAB — CBC WITH DIFFERENTIAL/PLATELET
BASO%: 0.5 % (ref 0.0–2.0)
Eosinophils Absolute: 0.2 10*3/uL (ref 0.0–0.5)
MONO#: 0.7 10*3/uL (ref 0.1–0.9)
MONO%: 6.8 % (ref 0.0–14.0)
NEUT#: 6.6 10*3/uL — ABNORMAL HIGH (ref 1.5–6.5)
RBC: 4.55 10*6/uL (ref 3.70–5.45)
RDW: 13.8 % (ref 11.2–14.5)
WBC: 9.6 10*3/uL (ref 3.9–10.3)

## 2010-03-14 LAB — COMPREHENSIVE METABOLIC PANEL
ALT: 32 U/L (ref 0–35)
Albumin: 3.9 g/dL (ref 3.5–5.2)
Alkaline Phosphatase: 96 U/L (ref 39–117)
CO2: 27 mEq/L (ref 19–32)
Glucose, Bld: 155 mg/dL — ABNORMAL HIGH (ref 70–99)
Potassium: 3.6 mEq/L (ref 3.5–5.3)
Sodium: 141 mEq/L (ref 135–145)
Total Protein: 7.6 g/dL (ref 6.0–8.3)

## 2010-03-14 LAB — CANCER ANTIGEN 27.29: CA 27.29: 18 U/mL (ref 0–39)

## 2010-06-05 ENCOUNTER — Encounter: Payer: Self-pay | Admitting: Oncology

## 2010-06-05 ENCOUNTER — Encounter: Payer: Self-pay | Admitting: Urology

## 2010-06-06 ENCOUNTER — Encounter: Payer: Self-pay | Admitting: Oncology

## 2010-09-14 ENCOUNTER — Encounter (HOSPITAL_BASED_OUTPATIENT_CLINIC_OR_DEPARTMENT_OTHER): Payer: BC Managed Care – PPO | Admitting: Oncology

## 2010-09-14 ENCOUNTER — Other Ambulatory Visit: Payer: Self-pay | Admitting: Oncology

## 2010-09-14 DIAGNOSIS — C50219 Malignant neoplasm of upper-inner quadrant of unspecified female breast: Secondary | ICD-10-CM

## 2010-09-14 DIAGNOSIS — Z170 Estrogen receptor positive status [ER+]: Secondary | ICD-10-CM

## 2010-09-14 LAB — CBC WITH DIFFERENTIAL/PLATELET
BASO%: 0.9 % (ref 0.0–2.0)
Basophils Absolute: 0.1 10*3/uL (ref 0.0–0.1)
EOS%: 1.7 % (ref 0.0–7.0)
HCT: 38.5 % (ref 34.8–46.6)
HGB: 13.1 g/dL (ref 11.6–15.9)
MONO#: 0.8 10*3/uL (ref 0.1–0.9)
NEUT%: 61.7 % (ref 38.4–76.8)
RDW: 14.2 % (ref 11.2–14.5)
WBC: 9.9 10*3/uL (ref 3.9–10.3)
lymph#: 2.7 10*3/uL (ref 0.9–3.3)

## 2010-09-14 LAB — COMPREHENSIVE METABOLIC PANEL
ALT: 35 U/L (ref 0–35)
AST: 33 U/L (ref 0–37)
Albumin: 4.1 g/dL (ref 3.5–5.2)
BUN: 11 mg/dL (ref 6–23)
CO2: 24 mEq/L (ref 19–32)
Calcium: 10.6 mg/dL — ABNORMAL HIGH (ref 8.4–10.5)
Chloride: 103 mEq/L (ref 96–112)
Creatinine, Ser: 0.85 mg/dL (ref 0.40–1.20)
Potassium: 3.9 mEq/L (ref 3.5–5.3)

## 2010-09-15 LAB — CANCER ANTIGEN 27.29: CA 27.29: 12 U/mL (ref 0–39)

## 2010-09-23 ENCOUNTER — Encounter (HOSPITAL_BASED_OUTPATIENT_CLINIC_OR_DEPARTMENT_OTHER): Payer: BC Managed Care – PPO | Admitting: Oncology

## 2010-09-23 ENCOUNTER — Other Ambulatory Visit: Payer: Self-pay | Admitting: Oncology

## 2010-09-23 DIAGNOSIS — Z09 Encounter for follow-up examination after completed treatment for conditions other than malignant neoplasm: Secondary | ICD-10-CM

## 2010-09-23 DIAGNOSIS — C50219 Malignant neoplasm of upper-inner quadrant of unspecified female breast: Secondary | ICD-10-CM

## 2010-09-23 DIAGNOSIS — Z170 Estrogen receptor positive status [ER+]: Secondary | ICD-10-CM

## 2010-09-27 NOTE — Op Note (Signed)
[name], [name]              ACCOUNT NO.:  [id number]   MEDICAL RECORD NO.:  [id number]          PATIENT TYPE:  AMB   LOCATION:  DSC                          FACILITY:  [facility]   PHYSICIAN:  Thomas A. Cornett, M.D.DATE OF BIRTH:  Mar 22, 1966   DATE OF PROCEDURE:  01/25/2007  DATE OF DISCHARGE:                               OPERATIVE REPORT   PREOPERATIVE DIAGNOSIS:  History of breast cancer, status post  chemotherapy.   POSTOPERATIVE DIAGNOSIS:  History of breast cancer, status post  chemotherapy.   PROCEDURE:  Removal of right subclavian Port-A-Cath.   SURGEON:  Harriette Bouillon, MD   ANESTHESIA:  MAC with 0.25% Sensorcaine with epinephrine.   ESTIMATED BLOOD LOSS:  20 mL.   SPECIMEN:  None.   INDICATIONS FOR PROCEDURE:  The patient is a 45 year old female who has  completed chemotherapy for breast cancer.  She is here today to have her  Port-A-Cath removed, since she is done with it.   DESCRIPTION OF PROCEDURE:  The patient was brought to the operating room  and placed supine.  After induction of MAC anesthesia, the right  subclavian region was prepped and draped in a sterile fashion.  Incision  was made over a previous scar and a Port-A-Cath was identified in the  subcu tissues.  I placed my finger over the tract and then removed the  catheter in its entirety from the subclavian vein.  I oversewed this  with a 2-0 Vicryl stitch.  I then removed the remainder of the body the  Port-A-Cath and passed it off the field to include the stitches.  Cavity  was closed with 3-0 Vicryl.  A 4-0 Monocryl was used to close the skin  in subcuticular fashion.  Dermabond was placed as a dressing.  All final  counts of sponge, needle and instruments were found to be correct.  The  patient tolerated the procedure well.  She was taken to Recovery in  satisfactory condition.      Thomas A. Cornett, [name]  Electronically Signed     TAC/MEDQ  D:  01/25/2007  T:  01/26/2007  Job:   [id number]

## 2010-09-30 NOTE — H&P (Signed)
NAMELINCOLN, KLEINER              ACCOUNT NO.:  [id number]   MEDICAL RECORD NO.:  [id number]          PATIENT TYPE:  INP   LOCATION:  1429                         FACILITY:  [facility]   PHYSICIAN:  Bertram Millard. Dahlstedt, M.D.DATE OF BIRTH:  June 24, 1965   DATE OF ADMISSION:  06/11/2006  DATE OF DISCHARGE:  06/15/2006                              HISTORY & PHYSICAL   REASON FOR ADMISSION:  Left lower pole renal mass.   BRIEF HISTORY:  A 45 year old female who presents at this time for  partial nephrectomy of the left kidney.  She was diagnosed with breast  cancer in 2007.  This was estrogen receptor positive.  She has not  undergone chemotherapy yet, as metastatic survey included an abdominal  scan which revealed a 2 cm mass in the lower aspect of left kidney.  CT-  directed biopsy revealed probable renal cell carcinoma.   At this point, the patient presents for possible laparoscopic partial  nephrectomy to spare parenchyma.  Additionally, because her breast tumor  is estrogen receptor positive, she will be undergone a bilateral  oophorectomy to obviate the need for long-term estrogen ablative  therapy.  She is aware of risks.   MEDICAL HISTORY:  1. Significant for her breast cancer.  She underwent right mastectomy      in 2007.  2. She is hypertensive.  3. Fibromyalgia.  4. She had her Port-A-Cath placed as well in 2007.   MEDICATIONS:  Include Coumadin 1 mg daily.  She stopped this is a week  ago.  She is also on Ambien p.r.n.  There are no known drug allergies.   The patient is married and has children.  She does not currently work.  She does not use tobacco.   FAMILY HISTORY:  Noncontributory.   REVIEW OF SYSTEMS:  Noncontributory.   PHYSICAL EXAMINATION:  GENERAL:  A pleasant adult female.  VITAL SIGNS:  Blood pressure 146/95, pulse 75, temperature 97.6.  NECK:  Supple.  No supraclavicular cervical adenopathy.  LUNGS:  Clear.  HEART:  Normal rate and rhythm.  ABDOMEN:   Mildly protuberant, soft, nondistended, nontender.  No mass,  organomegaly.  Bladder nonpalpable.  No CVA tenderness, no flank mass.  EXTERNAL GENITALIA:  Normal.  EXTREMITIES:  No peripheral edema.  DP and PT pulses in both feet were  normal.  SKIN:  Warm and dry.   LABORATORIES ON ADMISSION:  Hematocrit 39.7%, white count 10,200,  platelet count was normal at 360,000.  Coagulation studies were normal.  BMET was normal.   IMPRESSION:  1. Left lower pole renal mass, renal cell carcinoma.  2. History of breast carcinoma of the right breast status post      mastectomy.   PLAN:  Admit for partial nephrectomy, we will attempt laparoscopically.  She is aware of risks and complications and desires to proceed.      Bertram Millard. Dahlstedt, [name]  Electronically Signed     SMD/MEDQ  D:  07/23/2006  T:  07/23/2006  Job:  [id number]

## 2010-09-30 NOTE — Op Note (Signed)
[name], [name]              ACCOUNT NO.:  [id number]   MEDICAL RECORD NO.:  [id number]          PATIENT TYPE:  INP   LOCATION:  5712                         FACILITY:  [facility]   PHYSICIAN:  Maisie Fus A. Cornett, M.D.DATE OF BIRTH:  25-Feb-1966   DATE OF PROCEDURE:  02/02/2006  DATE OF DISCHARGE:  02/03/2006                                 OPERATIVE REPORT   PREOPERATIVE DIAGNOSIS:  Right breast cancer.   POSTOPERATIVE DIAGNOSIS:  Right breast cancer.   PROCEDURE:  1. Right simple mastectomy.  2. Right sentinel lymph node mapping to the right axilla with injection of      methylene blue dye.  3. Left subclavian 8-French Port-A-Cath placement with fluoroscopy.   SURGEON:  Dr. Harriette Bouillon.   ANESTHESIA:  LMA.   ASSISTANT:  Dr. Alda Ponder.   ESTIMATED BLOOD LOSS:  150 mL.   DRAINS:  Two round 19 Blake drains to right mastectomy site.   SPECIMEN:  1. Right breast to pathology.  2. Five sentinel lymph nodes that were negative by touch prep.   INDICATIONS FOR PROCEDURE:  The patient is a 45 year old female with a large  right breast cancer.  This was proven by core biopsy and she elected to  undergo mastectomy and is here for that today.  A Port-A-Cath will also be  placed given the large size of her tumor in her right breast.  The procedure  was discussed with the patient preoperatively as well as the need for a [employer], she understood and agreed to proceed.   DESCRIPTION OF PROCEDURE:  The patient was brought to the operating room  after being injected by nuclear medicine into her right breast  preoperatively.  She was brought to the operating room where she was placed  supine on the operating room table.  The right upper chest and left upper  chest were prepped and draped in a sterile fashion.  I injected 4 mL of  methylene blue dye in a subareolar position just below the right nipple  areolar complex.  Five minutes of massage were then used.  After prepping  and draping the patient sterilely, a NeoProbe was used, an incision was made  in the right axilla.  There were significant high counts and dissection was  carried down into the right axilla.  A total of five sentinel nodes were  found, four of these were blue and hot and were sent to pathology for  evaluation.  All five were negative by touch prep.  Next a mastectomy was  performed.  A superior flap and inferior flap were both created using the  cautery and scalpel.  The superior flap was taken to the clavicle and the  inferior flap was taken just to the inferior portion of the inferior mammary  fold.  Cautery was used to dissect the breast off the chest wall in a medial  to lateral fashion.  The entire breast was then removed and passed off the  field.  Irrigation was used, the wound was hemostatic and this was suctioned  out.  3-0 Vicryl was used to  secure the flap down to the chest wall.  I used  3-0 Vicryl to approximate the edges of the skin.  Through separate stab  incisions, two 19-French Blake drains were placed for drainage.  These were  secured with 3-0 nylon.  At this point, I then closed the skin with a  running 4-0 Monocryl.  Steri-Strips and dry dressings were applied.   The left Port-A-Cath was then placed.  The patient was placed in  Trendelenburg.  The left subclavian vein was cannulated easily and a wire  was fed through the needle without difficulty with the patient in  Trendelenburg.  A small incision was made below the wire insertion site.  This was taken down to the fascia in the pectoralis major and a small pocket  was made over the pectoralis major.  Fluoroscopy revealed the wire to be  going into the left subclavian vein through the innominate into the superior  vena cava without difficulty.  At this point, the Port-A-Cath was brought on  the field and cut to 20 cm.  It was flushed with heparinized saline.  It was  then tunneled from the lower incision to the upper  incision.  The catheter  was then attached to the reservoir and this was flushed as well. I then put  the patient back in Trendelenburg and used the dilator to dilate over the  wire moving the wire to-and-fro without any resistance.  Next a dilator and  introducer complex were placed and were advanced slowly moving the wire to-  and-fro without difficulty.  Once the introducer was in place, I removed the  wire and the dilator.  The catheter was placed in the peel-away sheath and  was fed into the sheath.  We then peeled away the sheath holding the  catheter in place without difficulty.  Fluoroscopy revealed the tip to be in  the superior vena cava.  The reservoir was secured to the chest wall with 2-  0 Prolene.  The wounds were then closed with a combination of 3-0 Vicryl and  4-0 Monocryl.  Steri-Strips and dry dressings were applied.  Both wounds  were then dressed sterilely.  All final counts of sponge, needle and  instruments were found to be correct at this portion of the case.  The  patient was awoke and taken to recovery in satisfactory condition.  A chest  x-ray will be obtained to check final line placement.      Thomas A. Cornett, [name]  Electronically Signed     TAC/MEDQ  D:  02/02/2006  T:  02/05/2006  Job:  [id number]   cc:   Miguel Aschoff, [name]

## 2010-09-30 NOTE — Op Note (Signed)
[name], [name]              ACCOUNT NO.:  [id number]   MEDICAL RECORD NO.:  [id number]          PATIENT TYPE:  INP   LOCATION:  5709                         FACILITY:  [facility]   PHYSICIAN:  Maisie Fus A. Cornett, M.D.DATE OF BIRTH:  February 15, 1966   DATE OF PROCEDURE:  DATE OF DISCHARGE:  02/20/2006                                 OPERATIVE REPORT   PREOPERATIVE DIAGNOSIS:  Infected left subclavian Port-A-Cath.   POSTOPERATIVE DIAGNOSIS:  Infected left subclavian Port-A-Cath.   PROCEDURE:  Removal of left subclavian Port-A-Cath.   SURGEON:  Maisie Fus A. Cornett, [name]   ANESTHESIA:  MAC with 0.25% Sensorcaine local.   ESTIMATED BLOOD LOSS:  Zero.   DRAINS:  None.   SPECIMEN:  Port-A-Cath tip sent for culture.   INDICATIONS FOR PROCEDURE:  The patient is a 45 year old female with newly  diagnosed breast cancer.  She had a right mastectomy and subsequent Port-A-  Cath placed on the left.  It developed pain over the last couple of days,  began to drain pus, and she was seen today in the office and sent over today  to have this removed since there was purulent material coming from the open  incision.   DESCRIPTION OF PROCEDURE:  The patient was brought to the operating room and  placed supine.  The left subclavian region was prepped and draped in sterile  fashion.  I opened the wound with a scalpel blade and it fell apart quite  easily.  Of note, the Port-A-Cath had flipped over, probably from infection,  eroding the tissue between the sutures and bottom part of the Port-A-Cath.  I placed a finger over the tract and removed the Port-A-Cath very easily.  I  oversewed the tract with 3-0 Vicryl to close the hole where a catheter went  into the subclavian vein.  The wound was then packed open with saline-soaked  Kerlix and dry dressing was applied.  I had the patient cough and saw no  signs of bleeding or sucking of air from the wound, and felt the closure was  excellent.  All final  counts of sponge, needles and instruments was found to  be correct.  The patient was awakened, taken to recovery in satisfactory  condition.      Thomas A. Cornett, [name]  Electronically Signed     TAC/MEDQ  D:  02/19/2006  T:  02/21/2006  Job:  [id number]

## 2010-09-30 NOTE — Discharge Summary (Signed)
[name], [name]              ACCOUNT NO.:  [id number]   MEDICAL RECORD NO.:  [id number]          PATIENT TYPE:  INP   LOCATION:  5709                         FACILITY:  [facility]   PHYSICIAN:  Maisie Fus A. Cornett, M.D.DATE OF BIRTH:  May 07, 1966   DATE OF ADMISSION:  02/19/2006  DATE OF DISCHARGE:  02/20/2006                                 DISCHARGE SUMMARY   ADMITTING DIAGNOSES:  1. Infected left subclavian port-A-Cath.  2. History of right breast cancer.   DISCHARGE DIAGNOSES:  1. Infected left subclavian port-A-Cath.  2. History of right breast cancer.   PROCEDURE PERFORMED:  Removal of left subclavian port-A-Cath.   BRIEF HISTORY:  The patient is a 45 year old female who underwent a right  simple mastectomy with sentinel lymph node mapping and a left subclavian  port-A-Cath placement about 2 days ago.  She was readmitted for infection of  her port-A-Cath, which was removed last night.   HOSPITAL COURSE:  The patient had one day of fever and was held until her  fever curve broke.  She was on antibiotics for this, and her port-A-Cath had  been removed.  The wound was clean, dry, and intact.  She was discharged  home on postoperative day 2 in satisfactory condition.   DISCHARGE INSTRUCTIONS:  She will follow up with me in seven days.  She will  be given a prescription for doxycycline to take for the next 10 day, 100 mg  p.o. b.i.d.  Cultures were sent, and we will await these.  Wet-to-dry  dressings will be done to the left subclavian port-A-Cath site removal  wound.   CONDITION AT DISCHARGE:  Improved.      Thomas A. Cornett, [name]  Electronically Signed     TAC/MEDQ  D:  03/07/2006  T:  03/08/2006  Job:  [id number]

## 2010-09-30 NOTE — Op Note (Signed)
NAME:  [name], [name]                        ACCOUNT NO.:  [id number]   MEDICAL RECORD NO.:  [id number]                   PATIENT TYPE:  AMB   LOCATION:  NESC                                 FACILITY:  [facility]   PHYSICIAN:  Rozanna Boer., [name]      DATE OF BIRTH:  03-18-1966   DATE OF PROCEDURE:  05/16/2002  DATE OF DISCHARGE:                                 OPERATIVE REPORT   PREOPERATIVE DIAGNOSIS:  Left distal ureteral calculus, 1 cm.   POSTOPERATIVE DIAGNOSIS:  Left distal ureteral calculus, 1 cm.   OPERATION:  Ureteroscopy and stone extraction.   ANESTHESIA:  General.   SURGEON:  Courtney Paris, [name]   BRIEF HISTORY:  This 45 year old white female presented with a left ureteral  stone the day after Christmas.  CT scan showed moderate hydronephrosis and a  11-12 mm mid ureteral stone.  This has moved down to the distal ureter where  it has remained stable, and she wishes now to have this out by ureteroscopy  if at all possible.  She had previous ESL x 2, March 2000 and April 2000,  and ureteroscopy around 1998.  She understands the risks involved including  possible ureteral injury with holmium laser and/or the ureteroscope but  wishes to have attempted removal of the stone at this time.   DESCRIPTION OF PROCEDURE:  The patient was placed on the operating table in  dorsal lithotomy position.  After satisfactory induction of general  anesthesia, was given antibiotics.  The #21 panendoscope was inserted in the  bladder which was carefully inspected and no bladder mucosal lesions seen.  The left orifice was normal.  A .038 floppy tip guidewire was passed beyond  the stone without difficulty up to the level of the kidney on fluoroscopy.  I tried to pass the 6.5 [place] short ureteroscope but could not get it much  past the opening of the ureteral meatus without dilating.  So this was done  with the balloon dilator just inside the orifice, just down to the stone  as  seen on fluoroscopy.  This was maintained for four timed minutes at 14  atmospheres.  I then tried to pass the ureteroscope, but again it seemed to  meet an obstruction, so I had to redilate up a little bit further again, at  this time, 12 atmospheres for a total of three timed minutes with the 4 cm  balloon.  When this was done, I passed the scope, and I could see that the  stone had fragmented with this maneuver.  I was able to get it past the  stone fairly easily and with about three passes with the basket, was able to  entrap most if not all of the stone fragments.  Over the guidewire, a 6  French x 24 cm length double-J ureteral stent was fashioned with a good coil  in the proximal renal pelvis and the bladder when the guidewire was removed.  The string was left off, and I will have her come back to the office and  take this out in a few days.  The bladder was drained, a B&O suppository  inserted, and the patient taken to the recovery room in good condition where  she will be later discharged with detailed written instructions.                                               Rozanna Boer., [name]    HMK/MEDQ  D:  05/16/2002  T:  05/16/2002  Job:  [id number]

## 2010-09-30 NOTE — Op Note (Signed)
[name], [name]              ACCOUNT NO.:  [id number]   MEDICAL RECORD NO.:  [id number]          PATIENT TYPE:  INP   LOCATION:  1429                         FACILITY:  [facility]   PHYSICIAN:  Bertram Millard. Dahlstedt, M.D.DATE OF BIRTH:  1966/01/02   DATE OF PROCEDURE:  06/11/2006  DATE OF DISCHARGE:                               OPERATIVE REPORT   PREOPERATIVE DIAGNOSIS:  Renal cell carcinoma of the left lower pole.   POSTOPERATIVE DIAGNOSIS:  Renal cell carcinoma of the left lower pole.   SURGICAL PROCEDURES:  Attempted laparoscopic converted to left open  partial nephrectomy.   SURGEON:  Bertram Millard. Dahlstedt, [name]   FIRST ASSISTANT:  Crecencio Mc, [name]   SECOND ASSISTANT:  Cornelious Bryant, [name]   ANESTHESIA:  General.   COMPLICATIONS:  Small vessel from renal hilum that bled, precipitating  conversion to open procedure.   ESTIMATED BLOOD LOSS:  800 cc.   SPECIMEN:  Renal mass, margin of renal mass, left anterior renal mass.   BRIEF HISTORY:  The patient is a 45 year old female who presents at this  time for probable partial nephrectomy of the left kidney.  She was  diagnosed with breast cancer and received a left modified radical  mastectomy earlier in 2007.  She has a Port-A-Cath and will be starting  chemotherapy fairly soon.   Prior to chemotherapy, Dr. Beatris Ship, her oncologist, performed a  staging CT chest, abdomen and pelvis.  Abdomen revealed a 2 cm mass on  the lower aspect of the left kidney.  No other abnormalities were seen.  CT directed biopsy revealed renal cell carcinoma.   The patient is 45, and other than this small area on her kidney, has  normal function of her kidneys.  It was recommended that she undergo  attempted partial nephrectomy to preserve renal parenchyma.  Risks and  complications of the procedure were discussed with the patient.  These  include but are not limited to bleeding, need for transfusion,  infection, DVT, PE, needing to  convert to open procedure, possible total  nephrectomy.  She understands these and desires to proceed.   The patient has an estrogen receptor positive tumor.  For this reason,  she was going to be on antiestrogen therapy.  It was suggested by Dr.  Caron Presume that she undergo bilateral oophorectomy to obviate the need for  long-term medical therapy.  Dr. Miguel Aschoff will be performing bilateral  salpingo-oophorectomy prior to the laparoscopic partial nephrectomy.   DESCRIPTION OF PROCEDURE:  The patient was administered general  anesthetic by the endotracheal route.  I then placed her in the left  flank up position after her bladder had been catheterized.  All  extremities were padded appropriately and an axillary roll was placed.  The table was then placed such that the abdomen was flat with a bit of  Trendelenburg.  Her entire abdomen was then prepped and draped.  Dr.  Tenny Craw then performed the bilateral salpingo-oophorectomy.  A 12 mm trocar  was placed in the infraumbilical area in the midline, another 12 mm  trocar placed in the left lower quadrant and  a 5 mm trocar placed in the  right lower quadrant.   After completion of the bilateral salpingo-oophorectomy, I then placed a  5 mm trocar in the upper midline and then another 5 mm trocar in the  left upper quadrant anterior axillary line.  These were placed under  direct vision.  The colon was then mobilized using the harmonic scalpel.  The splenic flexure was also mobilized, taking care to avoid injury to  the underlying peritoneal attachments.  The colon was then reflected  medially, with Gerota's fascia being easily identified behind this.  Dissection was then carried down medially on the inferior edge of the  kidney such that the gonadal vein and the ureter were identified.  These  were lifted anteriorly and dissection was carried down on the psoas  muscle, thus freeing the lower posterior part of the kidney.  Dissection  was then  carried up superiorly towards the renal hilum.  It should be  noted that the fat was quite thickened and firm in this area.  The  planes were hard to establish.  Dissection was carried up so that we saw  the renal vein.  It was a retroaortic renal vein which I had identified  previously on the CT scan.  Upon dissection of the renal vein on the  superior edge of this, a small, less than 1 mm branch became avulsed  from it.  There was no significant stump coming off of the anterior  surface of the renal vein.  I was unable to get a clip on anything on  that side.  I was able to stop the bleeding intermittently despite  placing some fat over this and some pressure.  This allowed me to  dissect up towards the renal artery for eventual clamping.  After about  45 minutes of dissecting in the hilar area, it was quite evident that we  are not going to be able to get any significant clip on this to stop the  venous oozing.  Because of this, I felt it wise to proceed with an open  procedure.  A left subcostal incision was then made connecting the two  upper abdominal port sites.  We opened this up the after the entire  lower pole of the kidney had been mobilized laparoscopically.  We were  able to see the small venous bleeding off of the anterior superior  surface of the renal vein.  I was able to get a right angle clamp on the  side of the renal vein and 3-0 silk suture was placed to ligate a small  bleeder.  The other end of the vein was just cauterized.  The renal  artery was eventually identified superior to the vein.  It was  circumscribed and a vessel loop was placed.  At this point we turned our  attention down to the lower pole of the kidney.  The perinephric fat in  the lower pole was removed and sent as perinephric fat.  The tumor was  easily identified and was approximately 1.5 to 2 cm across.  We could see that it was partially exophytic, but it did look like it had  penetrated deep  through the tissue.  We scored the renal capsule  approximately 0.5 cm wider than the tumor.  12.5 grams of mannitol was  administered IV and a few minutes later the renal vein was clamped with  a bulldog.  The scissors were used to excise the tumor deep.  After  the  kidney and tumor had been excised, a look at the specimen revealed tumor  bulging from the superior surface.  Looking at the kidney side, however,  I could not see any evident tumor left.  I did take a deep margin in an  area consistent with this tumor bulge.  The margin was sent for frozen  and returned as no evidence of a cancer.  At this point the large  bleeders were controlled using 2-0 chromic suture.  This controlled a  significant amount of the venous bleeding.  I then cauterized the  surface of the kidney with the argon beam coagulator.  FloSeal and  Surgicel bolster were then placed and sutured to the kidney defect  tightly with 2-0 chromic.  At this point the clamp was removed.  Total  clamp time was 17 minutes.  Another 12.5 grams of mannitol was  administered.  No bleeding was seen from the biopsy site at this point.  The kidney pinked up quite nicely after the clamp was removed.   A JP drain was placed through the left lower quadrant incision and  placed just underneath the defect in the kidney.  It was sutured to skin  with 3-0 nylon.  The fascial defect in this area was closed with a 2-0  Vicryl.  The abdomen was irrigated with saline.  All sponges were  removed and the preliminary sponge count was correct.  The colon was  replaced in the renal fossa.  The renal fossa had been inspected before  this and was found to be hemostatic.  The large abdominal incision was  closed anatomically using two layers of a #1 PDS placed in a running  fashion.  Staples were placed.  The port site in the infraumbilical area  was closed using fascial sutures of 2-0 Vicryl and staples.  All other  port sites were closed with  staples.   The patient tolerated the procedure well.  Estimated blood loss was 800  cc.  She remained stable throughout the whole case and was taken to PACU  in stable condition after she was extubated.      Bertram Millard. Dahlstedt, [name]  Electronically Signed     SMD/MEDQ  D:  06/13/2006  T:  06/13/2006  Job:  [id number]   cc:   Pierce Crane, [name]  Fax: [phone]

## 2010-09-30 NOTE — Op Note (Signed)
   [name], [name]                        ACCOUNT NO.:  [id number]   MEDICAL RECORD NO.:  [id number]                   PATIENT TYPE:  OIB   LOCATION:  NA                                   FACILITY:  [facility]   PHYSICIAN:  Hilda Lias, [name]                DATE OF BIRTH:  [birth year]   DATE OF PROCEDURE:  03/20/2003  DATE OF DISCHARGE:                                 OPERATIVE REPORT   PREOPERATIVE DIAGNOSIS:  C5-C6 herniated disk, spondylosis.   POSTOPERATIVE DIAGNOSIS:  C5-C6 herniated disk, spondylosis.   PROCEDURE:  Anterior 5-6 diskectomy decompressing the spinal cord, bilateral  foraminotomy, interbody fusion with allograft, plates on C5 to C6,  microscope.   SURGEON:  Hilda Lias, [name]   ASSISTANT:  Hewitt Shorts, [name]   CLINICAL HISTORY:  The patient was admitted because of __________ to both  upper extremities, right worse than the left one. The patient had failed  conservative treatment. X-ray showed spondylosis at the disk at the level  of 5-6. The patient wished to proceed with surgery and the risks were  explained in the history and physical.   DESCRIPTION OF PROCEDURE:  The patient was taken to the operating room and  the left side of the neck  was prepped with Betadine. Drapes were applied. A  transverse incision was made through the skin and platysma down to the  cervical spine. X-rays were taken with difficulty because the patient is  fairly short with large shoulders. Nevertheless, after we pulled the arm, we  were able to see that the needle  was at the level of 5-6.   To visualize the area, we brought the microscope and we opened the anterior  ligament. With the mark correct, we did a total gross diskectomy. We opened  the posterior  ligament, and indeed to the right side there was a fragment  as well as spondylosis. Decompression of the spinal cord as well as the 5-6  nerve root was done.   With the drill we drilled the endplates of 5 and 6  and we proceeded down  with a bone graft of 7-mm height using BPX in the hollow part of the bone  graft. This was followed by a plate using the 4 screws. We did an x-ray, an  indeed we were able to see that the plates were in good position  as well as  the screws and the bone graft. The area was irrigated and the wound was  closed with Vicryl and Steri-Strips.                                               Hilda Lias, [name]    EB/MEDQ  D:  03/20/2003  T:  03/20/2003  Job:  [id number]

## 2010-09-30 NOTE — H&P (Signed)
NAMEWILLIE, LOY              ACCOUNT NO.:  [id number]   MEDICAL RECORD NO.:  [id number]          PATIENT TYPE:  INP   LOCATION:  5709                         FACILITY:  [facility]   PHYSICIAN:  Maisie Fus A. Cornett, M.D.DATE OF BIRTH:  11/21/1965   DATE OF ADMISSION:  02/19/2006  DATE OF DISCHARGE:  02/20/2006                                HISTORY & PHYSICAL   CHIEF COMPLAINT:  Infected port-A-Cath   HISTORY OF PRESENT ILLNESS:  Ms. Gorum a 45 year old female status post  right simple mastectomy with placement of left subclavian port-A-Cath.  She  developed pain and redness and discharge from the port and is admitted today  for port-A-Cath infection with removal of her port-A-Cath.   PAST MEDICAL HISTORY:  1. History of right breast cancer, status post right simple mastectomy.  2. History of neck surgery.  3. History of C-section.  4. Fibromyalgia.   PAST SURGICAL HISTORY:  Please see above.   SOCIAL HISTORY:  Denies tobacco or alcohol use.  She is married, with  numerous children.   MEDICATIONS:  1. Zolpidem 10 mg once a day.  2. Requip 25 mg at bedtime.  3. Tramadol 50 mg b.i.d.  4. Keflex.  She is now off Keflex.   ALLERGIES:  NO KNOWN DRUG ALLERGIES.   FAMILY HISTORY:  Positive for diabetes and hypertension.   REVIEW OF SYSTEMS:  Positive for fever, chills, and pain at her port-A-Cath  site.   PHYSICAL EXAMINATION:  VITAL SIGNS:  Temperature is 99, vital Signs stable.  GENERAL APPEARANCE:  Pleasant female in no apparent distress.  CHEST:  Left port-A-Cath site is open, with drainage of pus. No redness.  Right mastectomy incision looks clean, dry, and intact, without signs of  hematoma or erythema.  ABDOMEN:  Soft, nontender.  LUNGS:  Clear to auscultation.  CARDIOVASCULAR:  Regular rate and rhythm.   IMPRESSION:  Infected left subclavian port-A-Cath.   PLAN:  Will admit for removal in the operating room.      Thomas A. Cornett, [name]  Electronically  Signed     TAC/MEDQ  D:  03/07/2006  T:  03/08/2006  Job:  [id number]

## 2010-09-30 NOTE — Op Note (Signed)
[name], [name]              ACCOUNT NO.:  [id number]   MEDICAL RECORD NO.:  [id number]          PATIENT TYPE:  AMB   LOCATION:  SDS                          FACILITY:  [facility]   PHYSICIAN:  Thomas A. Cornett, M.D.DATE OF BIRTH:  [birth year]   DATE OF PROCEDURE:  03/27/2006  DATE OF DISCHARGE:  03/27/2006                                 OPERATIVE REPORT   PREOPERATIVE DIAGNOSIS:  History of right breast cancer and poor venous  access.   POSTOPERATIVE DIAGNOSIS:  History of right breast cancer and poor venous  access.   PROCEDURE:  1. Placement of right subclavian power infusion port under fluoroscopy.  2. Right axial lymph node dissection.   SURGEON:  Harriette Bouillon, MD   ASSISTANT:  Dr. Manus Rudd.   DRAINS:  One 56 Blake to right axilla.   ANESTHESIA:  LMA with 0.25% Sensorcaine local.   ESTIMATED BLOOD LOSS:  30 mL.   SPECIMEN:  Right axillary contents to pathology.   INDICATIONS FOR PROCEDURE:  The patient is a 45 year old female who was  recently diagnosed with right breast cancer.  She had positive sentinel  lymph nodes and returned today for a right axillary lymph node dissection.  Also, she had a Port-A-Cath in place prior to this, but it got infected and  she is here to have the port replaced to resume chemotherapy.  The procedure  is discussed with the patient, and she voiced understanding and agreed to  proceed.   DESCRIPTION OF PROCEDURE:  The patient was brought to the operating room,  placed supine.  After induction of LMA anesthesia, the right upper chest,  right neck, and right axilla were prepped and draped in sterile fashion.  The Port-A-Cath was placed first.  The patient was placed in Trendelenburg.  The right subclavian vein was easily cannulated without difficulty with  return of dark nonpulsatile blood.  Wire was fed into the needle and needle  was removed.  Fluoroscopy was used to verify the wire going down the  superior vena cava.  Next,  a small stab wound was made at the wire, and this  hole was dilated with a hemostat.  Small incision was made below this, and  dissection was carried down to her chest wall.  A pocket was made for the  port.  Next, the power port was brought on the field and was attached.  It  was flushed with heparinized saline.  The port was then placed on the chest  wall, and the catheter was tunneled from the lower incision to the upper  incision.  The patient was then placed back in Trendelenburg.  The dilator  was passed over the wire moving the wire to-and-fro without difficulty.  Next, the dilator introducer complex was passed over the wire moving the  wire to-and-fro without difficulty.  I then removed both the wire and  dilator leaving the introducer in place.  We placed a catheter into the  introducer and peeled away the peel-away sheath without difficulty.  Fluoroscopy was used to verify location of the tip.  It appeared to be in  the right atrium, and I pulled this back a little bit so that it was more at  the right atrial caval junction and superior vena cava junction there.  We  were able to draw back out of it quite easily and flushed it without  difficulty.  We placed a single stitch to secure the port to the chest wall.  After this was done, I went ahead and accessed the port using the Donald  needle that came with the kit.  Next, the wound was closed with 3-0 Vicryl.  Monocryl 4-0, was used to close the skin incisions.  There is no kinking in  the port.  I then drew back with the [facility] needle in place.  It drew back  quite easily and flushed with heparinized saline.  At this point in time,  sterile dressings were placed over this.   Next, our attention was turned the right axilla.  I used the preexisting  mastectomy incision and injected this with 0.25% Sensorcaine.  Incision was  made in this old incision, and I had to extend it laterally somewhat.  We  dissected up into the right  axilla.  I was able to dissect out the axillary  contents away from the thoracodorsal trunk and from the chest wall medially.  We took this all way up to the axillary vein on the right.  The lymph  contents were then removed and passed off the field.  These were sent to  pathology for evaluation.  Through a separate stab incision, a 19 Blake  drain was placed.  This was placed to bulb suction and secured with 3-0  nylon to the skin.  Next, this wound was closed in layers using a 3-0 Vicryl  and a 4-0 Monocryl.  Sterile dressings were then applied to both.  The port  was left access.  All final counts of sponge, needle, and instruments were  found be correct at this portion of the case.  The patient was awoke, taken  to recovery in satisfactory condition.      Thomas A. Cornett, [name]  Electronically Signed     TAC/MEDQ  D:  03/27/2006  T:  03/27/2006  Job:  [id number]   cc:   Pierce Crane, [name]

## 2010-09-30 NOTE — Discharge Summary (Signed)
[name], [name]              ACCOUNT NO.:  [id number]   MEDICAL RECORD NO.:  [id number]          PATIENT TYPE:  INP   LOCATION:  1429                         FACILITY:  [facility]   PHYSICIAN:  Bertram Millard. Dahlstedt, M.D.DATE OF BIRTH:  1966/04/27   DATE OF ADMISSION:  06/11/2006  DATE OF DISCHARGE:  06/15/2006                               DISCHARGE SUMMARY   PRIMARY DIAGNOSIS:  Renal cell carcinoma of left kidney, pathologic  stage T1aNxMx, from a Fuhrman Nuclear Grade 2 of 4.   OTHER DIAGNOSES:  1. Breast cancer.  2. Hypertension.  3. Fibromyalgia.   PRINCIPAL PROCEDURES:  1. Left partial nephrectomy, laparoscopic with open conversion.  2. Bilateral salpingo-oophorectomy.   BRIEF HISTORY:  [name] is a 45 year old female who was admitted on  June 11, 2006, for attempted laparoscopic partial nephrectomy on the  left as well as bilateral salpingo-oophorectomy.  She carries the  diagnoses of renal cell carcinoma of the left lower pole and breast  cancer, having undergone a CT-directed biopsy of the left kidney.  Her  left renal mass was found upon a staging evaluation by Dr. Pierce Crane  before chemotherapy for her breast cancer.  She was diagnosed with that  in 2007 and underwent a right mastectomy.  Due to the patient's estrogen-  receptor-positive status of the tumor, it was recommended she undergo  her oophorectomy before left partial nephrectomy.  She presented at this  time for those procedures.   HOSPITAL COURSE:  [name] was admitted directly to the operating room  where Dr. Miguel Aschoff first performed a laparoscopic bilateral salpingo-  oophorectomy.  We then proceeded with attempted partial nephrectomy.  Due to some bleeding in the renal hilum, this was converted to an open  procedure.  She tolerated the procedure well.  Postoperatively, she had  an unremarkable course.  She was advanced to a regular diet without  problem and was discharged home on postoperative day  #4.  At that time,  she was eating a regular diet, incisions were healing well, and she was  ambulating.  She was afebrile.  Final pathology revealed benign ovaries  and tubes and a renal cell carcinoma, clear cell type, 2.2 cm in  greatest dimension.  Margins were uninvolved.   The patient was discharged on a regular diet, activity levels were  restricted, and she will follow up in my office in approximately 1 week  for staple removal.   DISCHARGE MEDICATIONS:  Included iron sulfate, Colace and Vicodin.      Bertram Millard. Dahlstedt, [name]  Electronically Signed     SMD/MEDQ  D:  07/23/2006  T:  07/23/2006  Job:  [id number]   cc:   Miguel Aschoff, [name]  Fax: [phone]   Pierce Crane, [name]  Fax: [phone]

## 2010-09-30 NOTE — H&P (Signed)
   NAME:  [name], [name]                        ACCOUNT NO.:  [id number]   MEDICAL RECORD NO.:  [id number]                   PATIENT TYPE:  OIB   LOCATION:  NA                                   FACILITY:  [facility]   PHYSICIAN:  Hilda Lias, [name]                DATE OF BIRTH:  April 16, 1966   DATE OF ADMISSION:  DATE OF DISCHARGE:                                HISTORY & PHYSICAL   HISTORY:  This is a lady who was admitted to the hospital because of neck  pain with radiation onto her right upper extremity associated with numbness.  The patient had conservative treatment including physical therapy with no  improvement.  MRI showed that she has spondylosis with a herniated disk,  central and right, at the L5-L6.  The patient wants to proceed with surgery  because of no improvement.   PAST MEDICAL HISTORY:  1. Cesarean section.  2. Knee surgery.   ALLERGIES:  CIPRO AND CODEINE.   SOCIAL HISTORY:  Noncontributory.   FAMILY HISTORY:  Mother had a history of neck and lower-back surgery.  Father died of diabetes.   REVIEW OF SYSTEMS:  Positive for depression, anxiety, fibromyalgia.   PHYSICAL EXAMINATION:  GENERAL:  The patient came to my office, and she was  having quite of bit of discomfort with the right arm.  HEENT:  Normal.  NECK:  She is able to flex and extend due to discomfort right shoulder.  LUNGS:  Clear.  HEART:  Sounds are normal.  Normal pulses.  NEUROLOGIC:  At this time, is normal.  Cranial nerves are normal.  Strength  show weakness of biceps __________.  Symmetrical.   Spondylosis at the level of L5-L6 with____________.  The MRI showed  spondylosis with encroachment of __________ disk at the L5-L6.  __________  radiculopathy.   IMPRESSION:  The patient is being admitted for surgery.  She will be one  layer of cervical diskectomy followed by fusion.   The risks were explained to the patient and includes infectious, CSF leak,  worsening of the pain, no  improvement whatsoever, need for further surgery,  damage to the spinal cord and paralysis.                                               Hilda Lias, [name]   EB/MEDQ  D:  03/20/2003  T:  03/20/2003  Job:  [id number]

## 2010-09-30 NOTE — Op Note (Signed)
[name], [name]              ACCOUNT NO.:  [id number]   MEDICAL RECORD NO.:  [id number]          PATIENT TYPE:  INP   LOCATION:  0011                         FACILITY:  [facility]   PHYSICIAN:  Miguel Aschoff, [name]       DATE OF BIRTH:  March 16, 1966   DATE OF PROCEDURE:  06/11/2006  DATE OF DISCHARGE:                               OPERATIVE REPORT   OPERATIVE DATE:  June 11, 2006.   PREOPERATIVE DIAGNOSES:  1. Estrogen-sensitive breast cancer now for laparoscopic bilateral      salpingo-oophorectomy.  2. Removed estrogen source.   POSTOPERATIVE DIAGNOSES:  1. Estrogen-sensitive breast cancer now for laparoscopic bilateral      salpingo-oophorectomy.  2. Removed estrogen source.   PROCEDURE:  Laparoscopic bilateral salpingo-oophorectomy.   SURGEON:  Dr. Miguel Aschoff.   ASSISTANT:  Dr. Marcine Matar.   ANESTHESIA:  General.   COMPLICATIONS:  None.   JUSTIFICATION:  The patient is a 45 year old, white female recently  diagnosed with invasive breast cancer which was estrogen-receptor  positive.  In view of the patient's young age and her additional  diagnosis of a malignant renal tumor for which she is to undergo a  laparoscopic partial nephrectomy, it was requested that a prophylactic  bilateral salpingo-oophorectomy be performed at this time in an effort  to reduce the need for anti-estrogen therapy to treat her breast cancer.  An informed consent has been obtained and the patient is now being taken  to undergo initially laparoscopic bilateral salpingo-oophorectomy  followed by Dr. Retta Diones performing laparoscopic partial nephrectomy.  The risks and benefits were discussed with the patient.   PROCEDURE:  The patient was taken to the operating room, placed in a  supine position, and then positioned for the nephrectomy portion of the  procedure.  A tenaculum was placed on the cervix and held.  After this  was done, she was prepped and draped in the usual sterile fashion  and  the bladder was catheterized.  A small, infraumbilical incision was made  and then the trocar to laparoscope was inserted and I __________ the  laparoscope itself ensuring that the position in the abdomen was  correct.  Attention was then made to the right lower quadrant where a 5  mm port was established under direct visualization and then a 12 mm port  was established in the right lower quadrant under direct visualization.  After this was done, the Gyrus unit was introduced into the pelvis.  The  right infundibulopelvic ligament was identified, grasped, and cauterized  for complete coagulation.  This was then coagulated again and then cut  and then dissection was carried medially along the mesovarian ligament  and mesosalpinx with serial cauterizations and cutting until the utero-  ovarian ligament was reached as well as the proximal portion of the  fallopian tube.  These were then grasped with Gyrus, cauterized and cut,  freeing the right tube ovary.  This was then placed in the anterior cul-  de-sac.  Attention was then directed to the left side.  The patient was  noted to have a paraovarian adhesions and  this area of these adhesions  were taken down sharply with care to avoid any injury to the vessels in  the pelvic or the ureter.  Once the area was restored to a normal  configuration, it was grasped at the utero-ovarian ligament with the  Gyrus unit, cauterized and cut, and dissection was carried laterally  along the mesovarian ligament and mesosalpinx until the  infundibulopelvic ligament was identified, grasped, cauterized doubly,  and then cut.  Inspection was made for hemostasis.  Hemostasis appeared  to be excellent.  At this point, EndoCatch unit was seen to be through  the left lower port.  Both tubes and ovaries were placed into the  EndoCatch unit and brought out through the 12 mm left lower quadrant  incision.  At this point, after a final inspection for hemostasis,  the  case was turned over to Dr. Retta Diones who is proceeding with his  laparoscopic renal surgery.  This is going to be dictated as a separate  note.  The patient tolerated this portion of the procedure well.  The  estimated blood loss was approximately 10 mL.      Miguel Aschoff, [name]  Electronically Signed     AR/MEDQ  D:  06/11/2006  T:  06/11/2006  Job:  [id number]   cc:   Bertram Millard. Dahlstedt, [name]  Fax: [phone]   Pierce Crane, [name]  Fax: [phone]

## 2010-12-26 ENCOUNTER — Ambulatory Visit
Admission: RE | Admit: 2010-12-26 | Discharge: 2010-12-26 | Disposition: A | Discharge status: Home / Self Care | Payer: BC Managed Care – PPO | Source: Ambulatory Visit | Attending: Oncology | Admitting: Oncology

## 2010-12-26 ENCOUNTER — Other Ambulatory Visit: Payer: Self-pay | Admitting: Oncology

## 2010-12-26 DIAGNOSIS — Z09 Encounter for follow-up examination after completed treatment for conditions other than malignant neoplasm: Secondary | ICD-10-CM

## 2010-12-26 DIAGNOSIS — Z1231 Encounter for screening mammogram for malignant neoplasm of breast: Secondary | ICD-10-CM

## 2011-02-24 LAB — POCT HEMOGLOBIN-HEMACUE
Hemoglobin: 13.5
Operator id: 116011

## 2011-03-23 ENCOUNTER — Telehealth: Payer: Self-pay | Admitting: *Deleted

## 2011-03-23 NOTE — Telephone Encounter (Signed)
Pt called and requested that we fax lab work she would need over to her PCP Venetia Maxon [phone] with results to be faxed to Dr Donnie Coffin [phone]

## 2011-03-24 ENCOUNTER — Other Ambulatory Visit: Payer: BC Managed Care – PPO

## 2011-03-31 ENCOUNTER — Ambulatory Visit (HOSPITAL_BASED_OUTPATIENT_CLINIC_OR_DEPARTMENT_OTHER): Payer: BC Managed Care – PPO | Admitting: Oncology

## 2011-03-31 ENCOUNTER — Telehealth: Payer: Self-pay | Admitting: Oncology

## 2011-03-31 DIAGNOSIS — Z8553 Personal history of malignant neoplasm of renal pelvis: Secondary | ICD-10-CM

## 2011-03-31 DIAGNOSIS — C50919 Malignant neoplasm of unspecified site of unspecified female breast: Secondary | ICD-10-CM | POA: Insufficient documentation

## 2011-03-31 DIAGNOSIS — E559 Vitamin D deficiency, unspecified: Secondary | ICD-10-CM | POA: Insufficient documentation

## 2011-03-31 DIAGNOSIS — Z170 Estrogen receptor positive status [ER+]: Secondary | ICD-10-CM

## 2011-03-31 DIAGNOSIS — C50219 Malignant neoplasm of upper-inner quadrant of unspecified female breast: Secondary | ICD-10-CM

## 2011-03-31 NOTE — Progress Notes (Signed)
Hematology and Oncology Follow Up Visit  [name] [id number] November 18, 1965 45 y.o. 03/31/2011 5:35 PM   Principle Diagnosis: Node positive, ER positive PR positive, HER-2/neu breast cancer status post mastectomy September 2007, status post 3 cycles of Taxotere, followed by 3 cycles of FEC currently on Femara #2 history of renal cell carcinoma status post partial nephrectomy 06/11/2006 #3 status post bilateral salpingo-oophorectomy  Interim History:  [name] returns for followup. She is doing well. She does take her Femara as well as vitamin D. She did have blood work done locally we do not have results. Most recent mammogram of the left breast done in August was normal. Overall she feels well  Medications: I have reviewed the patient's current medications.  Allergies: No Known Allergies  Past Medical History, Surgical history, Social history, and Family History were reviewed and updated.  Review of Systems: Constitutional:  Negative for fever, chills, night sweats, anorexia, weight loss, pain. Cardiovascular: no chest pain or dyspnea on exertion Respiratory: no cough, shortness of breath, or wheezing Neurological: no TIA or stroke symptoms Dermatological: negative ENT: negative Skin Gastrointestinal: no abdominal pain, change in bowel habits, or black or bloody stools Genito-Urinary: no dysuria, trouble voiding, or hematuria Hematological and Lymphatic: negative Breast: negative for breast lumps Musculoskeletal: negative Remaining ROS negative.  Physical Exam: Blood pressure 148/95, pulse 89, temperature 98.4 F (36.9 C), temperature source Oral, height 5\' 2"  (1.575 m), weight 185 lb 9.6 oz (84.188 kg). ECOG:  General appearance: alert, cooperative and appears stated age Head: Normocephalic, without obvious abnormality, atraumatic Neck: no adenopathy, no carotid bruit, no JVD, supple, symmetrical, trachea midline and thyroid not enlarged, symmetric, no  tenderness/mass/nodules Lymph nodes: Cervical, supraclavicular, and axillary nodes normal. Cardiac : Heart sounds are normal no murmurs Pulmonary: Normal breath sounds Breasts: Status post right mastectomy, chest wall unremarkable. Left breast and axilla is normal. Abdomen: No organomegaly or masses Extremities normal no edema or clubbing Neuro: Normal  Lab Results: Lab Results  Component Value Date   WBC 9.9 09/14/2010   HGB 13.1 09/14/2010   HCT 38.5 09/14/2010   MCV 86.1 09/14/2010   PLT 300 09/14/2010     Chemistry      Component Value Date/Time   NA 140 09/14/2010 1616   K 3.9 09/14/2010 1616   CL 103 09/14/2010 1616   CO2 24 09/14/2010 1616   BUN 11 09/14/2010 1616   CREATININE 0.85 09/14/2010 1616      Component Value Date/Time   CALCIUM 10.6* 09/14/2010 1616   ALKPHOS 114 09/14/2010 1616   AST 33 09/14/2010 1616   ALT 35 09/14/2010 1616   BILITOT 0.3 09/14/2010 1616       Radiological Studies: chest X-ray n/a Mammogram Normal 8/12 Bone density n/a  Impression and Plan: [name] is doing well. We will see her in 6 months at which point we will likely discontinue the Femara. She sees Dr. Retta Diones   in the next few months for reimaging of her renal cell cancer followup.  More than 50% of the visit was spent in patient-related counselling   Pierce Crane, MD 11/16/20125:35 PM

## 2011-03-31 NOTE — Telephone Encounter (Signed)
Mailed pts appt for [id number] on 03/31/2011

## 2011-05-29 ENCOUNTER — Other Ambulatory Visit: Payer: Self-pay | Admitting: *Deleted

## 2011-05-29 DIAGNOSIS — E559 Vitamin D deficiency, unspecified: Secondary | ICD-10-CM

## 2011-05-29 DIAGNOSIS — C50219 Malignant neoplasm of upper-inner quadrant of unspecified female breast: Secondary | ICD-10-CM

## 2011-05-29 DIAGNOSIS — C50919 Malignant neoplasm of unspecified site of unspecified female breast: Secondary | ICD-10-CM

## 2011-05-29 MED ORDER — LETROZOLE 2.5 MG PO TABS: 2.5000 mg | ORAL_TABLET | Freq: Every day | ORAL | Status: DC

## 2011-07-05 ENCOUNTER — Ambulatory Visit (HOSPITAL_COMMUNITY)
Admission: RE | Admit: 2011-07-05 | Discharge: 2011-07-05 | Disposition: A | Discharge status: Home / Self Care | Payer: BC Managed Care – PPO | Source: Ambulatory Visit | Attending: Urology | Admitting: Urology

## 2011-07-05 ENCOUNTER — Other Ambulatory Visit: Payer: Self-pay | Admitting: Urology

## 2011-07-05 DIAGNOSIS — C649 Malignant neoplasm of unspecified kidney, except renal pelvis: Secondary | ICD-10-CM

## 2011-07-05 DIAGNOSIS — M47814 Spondylosis without myelopathy or radiculopathy, thoracic region: Secondary | ICD-10-CM | POA: Insufficient documentation

## 2011-07-05 DIAGNOSIS — Z9010 Acquired absence of unspecified breast and nipple: Secondary | ICD-10-CM | POA: Insufficient documentation

## 2011-09-15 ENCOUNTER — Telehealth: Payer: Self-pay | Admitting: Oncology

## 2011-09-15 NOTE — Telephone Encounter (Signed)
S/w the pt and she is aware of her r/s may appts to June due to the md is out of the office °

## 2011-09-22 ENCOUNTER — Other Ambulatory Visit: Payer: BC Managed Care – PPO | Admitting: Lab

## 2011-09-29 ENCOUNTER — Ambulatory Visit: Payer: BC Managed Care – PPO | Admitting: Oncology

## 2011-10-31 ENCOUNTER — Other Ambulatory Visit (HOSPITAL_BASED_OUTPATIENT_CLINIC_OR_DEPARTMENT_OTHER): Payer: BC Managed Care – PPO | Admitting: Lab

## 2011-10-31 ENCOUNTER — Other Ambulatory Visit: Payer: Self-pay | Admitting: Oncology

## 2011-10-31 DIAGNOSIS — C50919 Malignant neoplasm of unspecified site of unspecified female breast: Secondary | ICD-10-CM

## 2011-10-31 DIAGNOSIS — E559 Vitamin D deficiency, unspecified: Secondary | ICD-10-CM

## 2011-10-31 LAB — CBC WITH DIFFERENTIAL/PLATELET
BASO%: 0.8 % (ref 0.0–2.0)
Basophils Absolute: 0.1 10*3/uL (ref 0.0–0.1)
EOS%: 2.2 % (ref 0.0–7.0)
HCT: 37.5 % (ref 34.8–46.6)
MCH: 28.7 pg (ref 25.1–34.0)
MCHC: 33.7 g/dL (ref 31.5–36.0)
MCV: 85.3 fL (ref 79.5–101.0)
MONO%: 8.8 % (ref 0.0–14.0)
NEUT%: 61.7 % (ref 38.4–76.8)
lymph#: 2.7 10*3/uL (ref 0.9–3.3)

## 2011-11-01 LAB — COMPREHENSIVE METABOLIC PANEL
ALT: 16 U/L (ref 0–35)
Albumin: 4.4 g/dL (ref 3.5–5.2)
Alkaline Phosphatase: 90 U/L (ref 39–117)
CO2: 25 mEq/L (ref 19–32)
Glucose, Bld: 76 mg/dL (ref 70–99)
Potassium: 4 mEq/L (ref 3.5–5.3)
Sodium: 141 mEq/L (ref 135–145)
Total Protein: 7.2 g/dL (ref 6.0–8.3)

## 2011-11-01 LAB — CANCER ANTIGEN 27.29: CA 27.29: 17 U/mL (ref 0–39)

## 2011-11-01 LAB — LACTATE DEHYDROGENASE: LDH: 172 U/L (ref 94–250)

## 2011-11-01 LAB — VITAMIN D 25 HYDROXY (VIT D DEFICIENCY, FRACTURES): Vit D, 25-Hydroxy: 62 ng/mL (ref 30–89)

## 2011-11-07 ENCOUNTER — Ambulatory Visit (HOSPITAL_BASED_OUTPATIENT_CLINIC_OR_DEPARTMENT_OTHER): Payer: BC Managed Care – PPO | Admitting: Oncology

## 2011-11-07 VITALS — BP 154/94 | HR 101 | Temp 98.3°F | Ht 62.0 in | Wt 190.6 lb

## 2011-11-07 DIAGNOSIS — C50919 Malignant neoplasm of unspecified site of unspecified female breast: Secondary | ICD-10-CM

## 2011-11-07 DIAGNOSIS — Z8553 Personal history of malignant neoplasm of renal pelvis: Secondary | ICD-10-CM

## 2011-11-07 DIAGNOSIS — Z170 Estrogen receptor positive status [ER+]: Secondary | ICD-10-CM

## 2011-11-07 DIAGNOSIS — E559 Vitamin D deficiency, unspecified: Secondary | ICD-10-CM

## 2011-11-07 NOTE — Progress Notes (Signed)
Hematology and Oncology Follow Up Visit  [name] [id number] 05-06-1966 45 y.o. 11/07/2011 3:54 PM   Principle Diagnosis: Node positive, ER positive PR positive, HER-2/neu breast cancer status post mastectomy September 2007, status post 3 cycles of Taxotere, followed by 3 cycles of FEC currently on Femara #2 history of renal cell carcinoma status post partial nephrectomy 06/11/2006 #3 status post bilateral salpingo-oophorectomy  Interim History:  [name] returns for followup. She is doing well. She does take her Femara as well as vitamin D. She did have blood work done locally we do not have results. Most recent mammogram of the left breast done in August was normal. Overall she feels well  Medications: I have reviewed the patient's current medications.  Allergies:  Allergies  Allergen Reactions  . Codeine Nausea And Vomiting    Past Medical History, Surgical history, Social history, and Family History were reviewed and updated.  Review of Systems: Constitutional:  Negative for fever, chills, night sweats, anorexia, weight loss, pain. Cardiovascular: no chest pain or dyspnea on exertion Respiratory: no cough, shortness of breath, or wheezing Neurological: no TIA or stroke symptoms Dermatological: negative ENT: negative Skin Gastrointestinal: no abdominal pain, change in bowel habits, or black or bloody stools Genito-Urinary: no dysuria, trouble voiding, or hematuria Hematological and Lymphatic: negative Breast: negative for breast lumps Musculoskeletal: negative Remaining ROS negative.  Physical Exam: Blood pressure 154/94, pulse 101, temperature 98.3 F (36.8 C), height 5\' 2"  (1.575 m), weight 190 lb 9.6 oz (86.456 kg). ECOG:  General appearance: alert, cooperative and appears stated age Head: Normocephalic, without obvious abnormality, atraumatic Neck: no adenopathy, no carotid bruit, no JVD, supple, symmetrical, trachea midline and thyroid not enlarged, symmetric,  no tenderness/mass/nodules Lymph nodes: Cervical, supraclavicular, and axillary nodes normal. Cardiac : Heart sounds are normal no murmurs Pulmonary: Normal breath sounds Breasts: Status post right mastectomy, chest wall unremarkable. Left breast and axilla is normal. Abdomen: No organomegaly or masses Extremities normal no edema or clubbing Neuro: Normal  Lab Results: Lab Results  Component Value Date   WBC 10.2 10/31/2011   HGB 12.6 10/31/2011   HCT 37.5 10/31/2011   MCV 85.3 10/31/2011   PLT 279 10/31/2011     Chemistry      Component Value Date/Time   NA 141 10/31/2011 1345   K 4.0 10/31/2011 1345   CL 107 10/31/2011 1345   CO2 25 10/31/2011 1345   BUN 11 10/31/2011 1345   CREATININE 0.76 10/31/2011 1345      Component Value Date/Time   CALCIUM 10.2 10/31/2011 1345   ALKPHOS 90 10/31/2011 1345   AST 16 10/31/2011 1345   ALT 16 10/31/2011 1345   BILITOT 0.4 10/31/2011 1345       Radiological Studies: chest X-ray n/a Mammogram Normal 8/12 Bone density n/a  Impression and Plan: [name] is doing well. We will see her in 6 months at which point we will likely discontinue the Femara. She sees Dr. Retta Diones   in the next few months for reimaging of her renal cell cancer followup.  More than 50% of the visit was spent in patient-related counselling   Pierce Crane, MD 6/25/20133:54 PM

## 2011-11-08 ENCOUNTER — Telehealth: Payer: Self-pay | Admitting: *Deleted

## 2011-11-08 NOTE — Telephone Encounter (Signed)
Made patient appointment for breast center 01-08-2012 arrival time 1:45pm left voice message to inform the patient of the new date and time of the mammogram and the bone density appointment

## 2012-01-08 ENCOUNTER — Ambulatory Visit: Payer: BC Managed Care – PPO

## 2012-01-08 ENCOUNTER — Other Ambulatory Visit: Payer: BC Managed Care – PPO

## 2012-01-18 ENCOUNTER — Ambulatory Visit: Payer: BC Managed Care – PPO

## 2012-01-18 ENCOUNTER — Other Ambulatory Visit: Payer: BC Managed Care – PPO

## 2012-02-14 ENCOUNTER — Ambulatory Visit
Admission: RE | Admit: 2012-02-14 | Discharge: 2012-02-14 | Disposition: A | Discharge status: Home / Self Care | Payer: BC Managed Care – PPO | Source: Ambulatory Visit | Attending: Oncology | Admitting: Oncology

## 2012-02-14 DIAGNOSIS — E559 Vitamin D deficiency, unspecified: Secondary | ICD-10-CM

## 2012-02-14 DIAGNOSIS — C50919 Malignant neoplasm of unspecified site of unspecified female breast: Secondary | ICD-10-CM

## 2012-04-15 ENCOUNTER — Telehealth: Payer: Self-pay | Admitting: Women's Health

## 2012-04-15 NOTE — Telephone Encounter (Signed)
Error wrong chart

## 2012-04-25 ENCOUNTER — Other Ambulatory Visit: Payer: Self-pay | Admitting: Oncology

## 2012-04-25 DIAGNOSIS — C50919 Malignant neoplasm of unspecified site of unspecified female breast: Secondary | ICD-10-CM

## 2012-05-27 ENCOUNTER — Other Ambulatory Visit: Payer: Self-pay | Admitting: Family Medicine

## 2012-05-27 DIAGNOSIS — M5414 Radiculopathy, thoracic region: Secondary | ICD-10-CM

## 2012-05-29 ENCOUNTER — Ambulatory Visit
Admission: RE | Admit: 2012-05-29 | Discharge: 2012-05-29 | Disposition: A | Discharge status: Home / Self Care | Payer: BC Managed Care – PPO | Source: Ambulatory Visit | Attending: Family Medicine | Admitting: Family Medicine

## 2012-05-29 DIAGNOSIS — M5414 Radiculopathy, thoracic region: Secondary | ICD-10-CM

## 2012-08-06 ENCOUNTER — Telehealth: Payer: Self-pay | Admitting: Family Medicine

## 2012-08-06 MED ORDER — BENAZEPRIL-HYDROCHLOROTHIAZIDE 20-12.5 MG PO TABS: 1.0000 | ORAL_TABLET | Freq: Every day | ORAL | Status: DC

## 2012-08-06 NOTE — Telephone Encounter (Signed)
rx refillled

## 2012-08-07 ENCOUNTER — Telehealth: Payer: Self-pay | Admitting: Family Medicine

## 2012-08-07 DIAGNOSIS — C50919 Malignant neoplasm of unspecified site of unspecified female breast: Secondary | ICD-10-CM

## 2012-08-07 DIAGNOSIS — E559 Vitamin D deficiency, unspecified: Secondary | ICD-10-CM

## 2012-08-07 MED ORDER — ZOLPIDEM TARTRATE 10 MG PO TABS: 10.0000 mg | ORAL_TABLET | Freq: Every day | ORAL | Status: DC

## 2012-08-07 NOTE — Telephone Encounter (Signed)
Ok to refill x 2  

## 2012-08-07 NOTE — Telephone Encounter (Signed)
.  [facility]

## 2012-08-07 NOTE — Telephone Encounter (Signed)
Ok to refill 

## 2012-08-13 ENCOUNTER — Ambulatory Visit: Payer: Self-pay | Admitting: Family Medicine

## 2012-09-02 ENCOUNTER — Encounter: Payer: Self-pay | Admitting: Oncology

## 2012-09-02 ENCOUNTER — Telehealth: Payer: Self-pay | Admitting: *Deleted

## 2012-09-02 NOTE — Telephone Encounter (Signed)
Pt received our letter and I requested to see Dr. Darnelle Catalan.  Confirmed 09/30/12 appt w/ pt.  Mailed letter & calendar to pt.

## 2012-09-30 ENCOUNTER — Ambulatory Visit (HOSPITAL_BASED_OUTPATIENT_CLINIC_OR_DEPARTMENT_OTHER): Payer: BC Managed Care – PPO | Admitting: Family

## 2012-09-30 ENCOUNTER — Telehealth: Payer: Self-pay | Admitting: Oncology

## 2012-09-30 ENCOUNTER — Encounter: Payer: Self-pay | Admitting: Family

## 2012-09-30 ENCOUNTER — Ambulatory Visit (HOSPITAL_BASED_OUTPATIENT_CLINIC_OR_DEPARTMENT_OTHER): Payer: BC Managed Care – PPO | Admitting: Lab

## 2012-09-30 VITALS — BP 128/88 | HR 96 | Temp 98.6°F | Resp 20 | Ht 62.0 in | Wt 196.1 lb

## 2012-09-30 DIAGNOSIS — C50219 Malignant neoplasm of upper-inner quadrant of unspecified female breast: Secondary | ICD-10-CM

## 2012-09-30 DIAGNOSIS — C773 Secondary and unspecified malignant neoplasm of axilla and upper limb lymph nodes: Secondary | ICD-10-CM

## 2012-09-30 DIAGNOSIS — N899 Noninflammatory disorder of vagina, unspecified: Secondary | ICD-10-CM

## 2012-09-30 DIAGNOSIS — C50919 Malignant neoplasm of unspecified site of unspecified female breast: Secondary | ICD-10-CM

## 2012-09-30 DIAGNOSIS — Z853 Personal history of malignant neoplasm of breast: Secondary | ICD-10-CM

## 2012-09-30 DIAGNOSIS — Z8553 Personal history of malignant neoplasm of renal pelvis: Secondary | ICD-10-CM

## 2012-09-30 LAB — COMPREHENSIVE METABOLIC PANEL (CC13)
Albumin: 3.9 g/dL (ref 3.5–5.0)
BUN: 10.1 mg/dL (ref 7.0–26.0)
Calcium: 10.7 mg/dL — ABNORMAL HIGH (ref 8.4–10.4)
Chloride: 105 mEq/L (ref 98–107)
Glucose: 83 mg/dl (ref 70–99)
Potassium: 3.7 mEq/L (ref 3.5–5.1)

## 2012-09-30 LAB — CBC WITH DIFFERENTIAL/PLATELET
BASO%: 0.5 % (ref 0.0–2.0)
Basophils Absolute: 0.1 10*3/uL (ref 0.0–0.1)
EOS%: 2.1 % (ref 0.0–7.0)
Eosinophils Absolute: 0.2 10*3/uL (ref 0.0–0.5)
HCT: 41.2 % (ref 34.8–46.6)
HGB: 14 g/dL (ref 11.6–15.9)
LYMPH%: 22.7 % (ref 14.0–49.7)
MCH: 28.9 pg (ref 25.1–34.0)
MCHC: 34 g/dL (ref 31.5–36.0)
MCV: 85.1 fL (ref 79.5–101.0)
MONO#: 0.9 10*3/uL (ref 0.1–0.9)
MONO%: 8.2 % (ref 0.0–14.0)
NEUT#: 7.7 10*3/uL — ABNORMAL HIGH (ref 1.5–6.5)
NEUT%: 66.5 % (ref 38.4–76.8)
Platelets: 291 10*3/uL (ref 145–400)
RBC: 4.84 10*6/uL (ref 3.70–5.45)
RDW: 13.8 % (ref 11.2–14.5)
WBC: 11.5 10*3/uL — ABNORMAL HIGH (ref 3.9–10.3)
lymph#: 2.6 10*3/uL (ref 0.9–3.3)

## 2012-09-30 LAB — LACTATE DEHYDROGENASE (CC13): LDH: 221 U/L (ref 125–245)

## 2012-09-30 MED ORDER — LETROZOLE 2.5 MG PO TABS: 2.5000 mg | ORAL_TABLET | Freq: Every day | ORAL | Status: DC

## 2012-09-30 NOTE — Progress Notes (Signed)
[facility]  Telephone:([phone] [phone]  OFFICE PROGRESS NOTE   ID: [name]   DOB: August 01, 1965  MR#: [id number]  JYN#:[id number]   PCP: Hannah Grosser, MD GYN: [name], [name] SU: [name], [name]  HISTORY OF PRESENT ILLNESS: From Dr. Theron Arista [name]'s new patient evaluation note dated 03/07/2006: "This is a pleasant 47 year old woman from [facility] referred by Dr. Luisa [name] for evaluation and treatment of breast cancer.  This woman has been in reasonably good health.  She noted a breast mass on June of this year and by August this area had become tender.  Bilateral diagnostic mammogram with right breast ultrasound was performed on 12/25/2005 and physical examination showed discrete thickening in right breast most pronounced at 12'o clock.  Ultrasound showed diffuse shadow in the breast most pronounced at 12'o clock.  An ultrasound and core biopsy was recommended.  Biopsy performed on 12/25/2005 showed invasive mammary carcinoma.  Lobular features were identified.  The tumor was strongly ER and PR positive at 94% and 98% respectively.  Tumor was HER2/neu 1+, FISH negative, and with a proliferative index of 11%.  MRI scan done on 01/04/2006 showed an enhancing mass in the upper inner quadrant of the right breast measuring 4.9 x 2.9 x 2.8 cm.  Lesion involves the subareolar area without directly involving the right nipple.  Images of the left breast show small intramammary lymph node at the lateral aspect of the breast.  No other abnormalities were seen.  After some discussion, the patient elected to undergo a mastectomy, which occurred on 02/02/2006.  Final pathology showed a 4.5 cm grade 2/3 ductal cancer, with vascular invasion was seen.  Surgical margins were clear.  A total of 5 sentinel lymph nodes were identified, two of which had metastatic carcinoma.  Postoperative course has been complicated by a port impaction and so her port has been removed.  She  has been on antibiotics, I believe she was on amoxicillin and now on doxycycline.  At the time of her port placement, she was thought to have an irregular heart rate."  Her subsequent history is as detailed below.  INTERVAL HISTORY: Dr. Darnelle [name] and I saw [name] today for follow up of  invasive ductal carcinoma with lobular features of the right breast. The patient was last seen by Dr. Donnie [name] on 11/07/2011.  Since her last office visit, the patient has been doing relatively well.  She is establishing herself with Dr. Darrall [name] service today.  REVIEW OF SYSTEMS: A 10 point review of systems was completed and is negative except ongoing fatigue, all over body aches, right shoulder blade pain, anxiety over breast cancer recurrence, ongoing insomnia, right-sided bone pain including right elbow right, right shoulder, bilateral foot pain, and vaginal dryness.  The patient denies any other symptomatology.   PAST MEDICAL HISTORY: Past Medical History  Diagnosis Date  . Breast cancer 12/2005    Right breast  . Cancer 02/2007    Renal cell carcinoma - Left kidney  . Hypertension   . Insomnia   . Fibromyalgia   . Ruptured disc, cervical   History of fibromyalgia with restless legs syndrome.  PAST SURGICAL HISTORY: Past Surgical History  Procedure Laterality Date  . Mastectomy Right 02/02/2006  . Partial nephrectomy Left 06/11/2006  . Cesarean section      C-section x 3  . Cervical disc surgery    . Salpingoophorectomy    . Port-a-cath removal    . Knee arthroscopy Right 1998  Includes, neck surgery in March 2003, kidney stone lithotripsy in 2003, 1999 and 1998, and three cesarean sections, and knee surgery in 1998.  FAMILY HISTORY Family History  Problem Relation Age of Onset  . Hypertension Mother   . Diabetes Father   . Stroke Father   . Hypertension Brother   . Cancer Maternal Aunt     Ovarian cancer  . Cancer Maternal Uncle     Colon Cancer  . Cancer Paternal Uncle      Brain cancer  . Cancer Maternal Grandmother     Unknown cancer origin  . Hypertension Brother   . Cancer Maternal Aunt     Colon Cancer  Two brothers and two sisters in good health.  Mother who is with her today is in good health.  There is a history of ovarian cancer in the maternal aunt and maternal grandmother may have had some form of intestinal cancer.   GYNECOLOGIC HISTORY: She is gravida 5, para 3 with two miscarriages. She has a history of menometrorrhagia.   SOCIAL HISTORY: She is married to her husband [name] who is a Education officer, environmental.  They've been married for 28 years.  They have 3 adult children, 2 daughters and one son.  They have 2 adult children living in their home including one of their daughters and their son.  Their oldest daughter is pregnant and they are expecting their first grandchild in 05/2013.  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  In her spare time she likes to read, go to church, and cook.   ADVANCED DIRECTIVES: Not on file  HEALTH MAINTENANCE: History  Substance Use Topics  . Smoking status: Former Smoker    Types: Cigarettes  . Smokeless tobacco: Never Used     Comment: Quit over 20 years ago  . Alcohol Use: No    Colonoscopy: N/A PAP: Not on file Bone density: Last bone density scan on 02/14/2012 showed a T score of -0.8  (normal). Lipid panel: Not on file  Allergies  Allergen Reactions  . Codeine Nausea And Vomiting    Current Outpatient Prescriptions  Medication Sig Dispense Refill  . benazepril-hydrochlorthiazide (LOTENSIN HCT) 20-12.5 MG per tablet Take 1 tablet by mouth daily.  30 tablet  3  . Calcium Carbonate-Vitamin D (CALTRATE 600+D) 600-400 MG-UNIT per tablet Take 1 tablet by mouth daily.      . cholecalciferol (VITAMIN D) 1000 UNITS tablet Take 5,000 Units by mouth daily.        Marland Kitchen letrozole (FEMARA) 2.5 MG tablet Take 1 tablet (2.5 mg total) by mouth daily.  90 tablet  8  . zolpidem (AMBIEN) 10 MG tablet Take 1 tablet  (10 mg total) by mouth at bedtime.  30 tablet  2   No current facility-administered medications for this visit.    OBJECTIVE: Filed Vitals:   09/30/12 1027  BP: 128/88  Pulse: 96  Temp: 98.6 F (37 C)  Resp: 20     Body mass index is 35.86 kg/(m^2).      ECOG FS: 1 - Symptomatic but completely ambulatory  General appearance: Alert, cooperative, well nourished, no apparent distress Head: Normocephalic, without obvious abnormality, atraumatic, numerous dental caries present Eyes: Conjunctivae/corneas clear, PERRLA, EOMI Nose: Nares, septum and mucosa are normal, no drainage or sinus tenderness Neck: No adenopathy, supple, symmetrical, trachea midline, cervical scar and thyroid area, no tenderness Resp: Clear to auscultation bilaterally Cardio: Regular rate and rhythm, S1, S2 normal, no murmur, click, rub or gallop Breasts: Right  breast is surgically absent, right chest wall has well-healed surgical scars, right chest wall tenderness, left breast has a firm medial inframammary ridge, no nipple inversion, bilateral axillary fullness GI: Soft, distended, non-tender, hypoactive bowel sounds, no organomegaly Skin: Lacerations on bilateral upper extremities in various stages of healing, abdominal striae Extremities: Extremities normal, atraumatic, no cyanosis or edema Lymph nodes: Cervical and supraclavicular are normal Neurologic: Grossly normal   LAB RESULTS: Lab Results  Component Value Date   WBC 11.5* 09/30/2012   NEUTROABS 7.7* 09/30/2012   HGB 14.0 09/30/2012   HCT 41.2 09/30/2012   MCV 85.1 09/30/2012   PLT 291 09/30/2012      Chemistry      Component Value Date/Time   NA 141 09/30/2012 1207   NA 141 10/31/2011 1345   K 3.7 09/30/2012 1207   K 4.0 10/31/2011 1345   CL 105 09/30/2012 1207   CL 107 10/31/2011 1345   CO2 24 09/30/2012 1207   CO2 25 10/31/2011 1345   BUN 10.1 09/30/2012 1207   BUN 11 10/31/2011 1345   CREATININE 0.8 09/30/2012 1207   CREATININE 0.76 10/31/2011 1345       Component Value Date/Time   CALCIUM 10.7* 09/30/2012 1207   CALCIUM 10.2 10/31/2011 1345   ALKPHOS 111 09/30/2012 1207   ALKPHOS 90 10/31/2011 1345   AST 23 09/30/2012 1207   AST 16 10/31/2011 1345   ALT 31 09/30/2012 1207   ALT 16 10/31/2011 1345   BILITOT 0.47 09/30/2012 1207   BILITOT 0.4 10/31/2011 1345       Lab Results  Component Value Date   LABCA2 17 10/31/2011    Urinalysis No results found for this basename: colorurine,  appearanceur,  labspec,  phurine,  glucoseu,  hgbur,  bilirubinur,  ketonesur,  proteinur,  urobilinogen,  nitrite,  leukocytesur    STUDIES: No results found.  ASSESSMENT: 47 y.[place]. [place], [place]: 1.  Status post right breast needle core biopsy on 12/25/2005 showed invasive mammary carcinoma, ER 94%, PR 98%, Ki-67 11%, HER-2/neu no amplification by FISH with a ratio of 1.2.  2.  Status post bilateral breast MRI on 01/04/2006 which showed within the upper-inner quadrant of the right breast, there was an enhancing mass measuring 4.9 x 2.9 x 2.8 cm.  The lesion appeared to involve the subareolar region without directly involving the right middle.  Images of the left breast show only small inframammary lymph node in the lateral aspect of the breast.  No suspicious enhancement was identified on the left.  No enlarged internal mammary or axillary lymph nodes are identified.  3.  Status post right breast mastectomy with sentinel node biopsy on 02/02/2006, for a stage IIB, pT2 pN1, 4.5 cm invasive ductal carcinoma with lobular features grade 2, ductal carcinoma in situ, ER 94%, PR 90%, Ki-67 11%, HER-2/neu by FISH no amplification, 2/5 positive metastatic lymph nodes.  4.  Status post right axillary regional resection of lymph nodes on 03/27/2006 which showed 2/13 metastatic lymph nodes.  5.  Status post abdominal CT with contrast on 03/28/2006 per protocol after bolus administration of intravenous contrast which showed an enhancing lesion in the  lower pole of the left kidney.  This represents a renal cell carcinoma unless proven otherwise.  6.  History of renal cell carcinoma status post partial nephrectomy on 06/11/2006.  Status post bilateral salpingo-oophorectomy on 06/11/2006 also.  7.  Status post adjuvant chemotherapy with FEC x 3 cycles with Neulasta support from 07/02/2006 through 08/13/2006.  This was followed by adjuvant chemotherapy with Taxotere with Neulasta support x 3 cycles from 09/03/2006 through 10/15/2006.    8.  Status post radiation therapy from 11/06/2006 through 12/21/2006.  9.  The patient started antiestrogen therapy with Arimidex from 12/2006 until 01/2008.  The patient was then switched to antiestrogen therapy with Femara in 01/2008.  10.  Vaginal dryness  PLAN: The patient will continue antiestrogen therapy for a total of 10 years (until 12/2016) per Dr. Darnelle [name] due to the patient having invasive ductal carcinoma with lobular features and positive metastatic lymph nodes.  A prescription for Letrozole 2.5 mg by mouth daily #90 with 8 refills was sent electronically to the patient's pharmacy.  The patient is currently taking calcium and vitamin D daily.  Her last bone density scan on 02/14/2012 showed a T score of -0.8 (normal).  The patient and I discussed various over-the-counter remedies for her vaginal dryness.  We plan to see the patient again in one year, at which time we will check a CBC, CMP, LDH, and vitamin D level.  A left unilateral screening mammogram for 02/2013 will be scheduled for the patient.    All questions were answered.  The patient was encouraged to contact us in the interim with any problems, questions or concerns.   Larina Bras, NP-C 09/30/2012, 5:26 PM

## 2012-09-30 NOTE — Patient Instructions (Addendum)
Please contact us at [phone] if you have any questions or concerns.  Please continue to do well and enjoy life!!!  Get plenty of rest, drink plenty of water, exercise daily (walking for 30 minutes), eat a balanced diet.  Take  Vitamin D3 1000 IUs daily.

## 2012-10-01 ENCOUNTER — Telehealth: Payer: Self-pay | Admitting: Family

## 2012-10-01 NOTE — Telephone Encounter (Signed)
Discussed with the patient about slight elevation in calcium may be due to calcium supplementation and/or HCTZ.  Asked her to stop calcium supplementation but to continue to take vitamin D3 and HCTZ.  Also asked her to have PCP check calcium during next OV.  The patient voiced understanding.

## 2012-10-31 ENCOUNTER — Other Ambulatory Visit: Payer: BC Managed Care – PPO | Admitting: Lab

## 2012-11-05 ENCOUNTER — Telehealth: Payer: Self-pay | Admitting: Family Medicine

## 2012-11-05 DIAGNOSIS — C50919 Malignant neoplasm of unspecified site of unspecified female breast: Secondary | ICD-10-CM

## 2012-11-05 DIAGNOSIS — E559 Vitamin D deficiency, unspecified: Secondary | ICD-10-CM

## 2012-11-06 MED ORDER — ZOLPIDEM TARTRATE 10 MG PO TABS: 10.0000 mg | ORAL_TABLET | Freq: Every day | ORAL | Status: DC

## 2012-11-06 NOTE — Telephone Encounter (Signed)
Ok to refill 

## 2012-11-06 NOTE — Telephone Encounter (Signed)
Rx Refilled  

## 2012-11-06 NOTE — Telephone Encounter (Signed)
?   OK to Refill (LOV1/13/14 bp check with [facility])

## 2012-11-07 ENCOUNTER — Ambulatory Visit: Payer: BC Managed Care – PPO | Admitting: Oncology

## 2012-12-06 ENCOUNTER — Encounter: Payer: Self-pay | Admitting: Family Medicine

## 2012-12-06 ENCOUNTER — Ambulatory Visit (INDEPENDENT_AMBULATORY_CARE_PROVIDER_SITE_OTHER): Payer: BC Managed Care – PPO | Admitting: Family Medicine

## 2012-12-06 VITALS — BP 122/86 | HR 100 | Temp 98.4°F | Resp 16 | Ht 61.0 in | Wt 195.0 lb

## 2012-12-06 DIAGNOSIS — E039 Hypothyroidism, unspecified: Secondary | ICD-10-CM

## 2012-12-06 DIAGNOSIS — E038 Other specified hypothyroidism: Secondary | ICD-10-CM

## 2012-12-06 DIAGNOSIS — I10 Essential (primary) hypertension: Secondary | ICD-10-CM | POA: Insufficient documentation

## 2012-12-06 MED ORDER — BENAZEPRIL-HYDROCHLOROTHIAZIDE 20-12.5 MG PO TABS: 1.0000 | ORAL_TABLET | Freq: Every day | ORAL | Status: DC

## 2012-12-06 NOTE — Progress Notes (Signed)
Subjective:    Patient ID: [name], female    DOB: 1966/02/01, 47 y.o.   MRN: [id number]  HPI Patient is here for followup of her hypertension she is currently taking Lotensin HCT 20/12.5 one by mouth daily. She denies any chest pain shortness of breath or dyspnea on exertion. She does have an occasional cough but is not problematic. She is overdue for a CMP and lipid panel. She also has a history of subclinical hypothyroidism. She is overdue to have her TSH rechecked. She does have some prominent obesity. She is exercising regularly although it is very low intensity. She has been eating more junk food and fast food due to her husbands were recent back surgery.  She's been taking gabapentin 300 mg by mouth 3 times a day for neuropathic pain in her back located near the site of her left nephrectomy. She states that it helps but she is interested she can increase the dose. She is overdue for a Pap smear but she defers that for now. Past Medical History  Diagnosis Date  . Breast cancer 12/2005    Right breast  . Cancer 02/2007    Renal cell carcinoma - Left kidney  . Insomnia   . Fibromyalgia   . Ruptured disc, cervical   . Hypertension    Past Surgical History  Procedure Laterality Date  . Mastectomy Right 02/02/2006  . Partial nephrectomy Left 06/11/2006  . Cesarean section      C-section x 3  . Cervical disc surgery    . Salpingoophorectomy    . Port-a-cath removal    . Knee arthroscopy Right 1998   Current Outpatient Prescriptions on File Prior to Visit  Medication Sig Dispense Refill  . cholecalciferol (VITAMIN D) 1000 UNITS tablet Take 5,000 Units by mouth daily.        Marland Kitchen letrozole (FEMARA) 2.5 MG tablet Take 1 tablet (2.5 mg total) by mouth daily.  90 tablet  8  . zolpidem (AMBIEN) 10 MG tablet Take 1 tablet (10 mg total) by mouth at bedtime.  30 tablet  0   No current facility-administered medications on file prior to visit.   Allergies  Allergen Reactions  . Codeine  Nausea And Vomiting   History   Social History  . Marital Status: Married    Spouse Name: N/A    Number of Children: N/A  . Years of Education: N/A   Occupational History  . Not on file.   Social History Main Topics  . Smoking status: Former Smoker    Types: Cigarettes  . Smokeless tobacco: Never Used     Comment: Quit over 20 years ago  . Alcohol Use: No  . Drug Use: No  . Sexually Active: Yes    Birth Control/ Protection: Surgical   Other Topics Concern  . Not on file   Social History Narrative  . No narrative on file      Review of Systems  All other systems reviewed and are negative.       Objective:   Physical Exam  Vitals reviewed. Constitutional: She appears well-developed and well-nourished.  Neck: Neck supple. No thyromegaly present.  Cardiovascular: Normal rate, regular rhythm and normal heart sounds.  Exam reveals no gallop and no friction rub.   No murmur heard. Pulmonary/Chest: Effort normal and breath sounds normal. No respiratory distress. She has no wheezes. She has no rales. She exhibits no tenderness.  Abdominal: Soft. Bowel sounds are normal. She exhibits no distension and no  mass. There is no tenderness. There is no rebound and no guarding.  Musculoskeletal: She exhibits no edema.  Lymphadenopathy:    She has no cervical adenopathy.  Skin: Skin is warm. No rash noted. No erythema.          Assessment & Plan:  1. Hypertension Blood pressure is well controlled. Return fasting for a CMP and lipid panel. The patient's goal LDL would be less than 130. If her calcium remains elevated, we may need to switch the patient off hydrochlorothiazide.  Did recommend therapeutic lifestyle changes including a 1500-calorie a day diet and 30 minutes a day of regular aerobic exercise. Also recommend patient to get a Pap smear at her convenience. - COMPLETE METABOLIC PANEL WITH GFR; Future - CBC with Differential; Future - Lipid panel; Future  2.  Subclinical hypothyroidism Return fasting and check a TSH. - TSH; Future

## 2012-12-08 ENCOUNTER — Telehealth: Payer: Self-pay | Admitting: Family Medicine

## 2012-12-08 DIAGNOSIS — C50919 Malignant neoplasm of unspecified site of unspecified female breast: Secondary | ICD-10-CM

## 2012-12-08 DIAGNOSIS — E559 Vitamin D deficiency, unspecified: Secondary | ICD-10-CM

## 2012-12-09 NOTE — Telephone Encounter (Signed)
Ok to refill x 3 

## 2012-12-09 NOTE — Telephone Encounter (Signed)
?   OK to Refill  

## 2012-12-10 MED ORDER — ZOLPIDEM TARTRATE 10 MG PO TABS: 10.0000 mg | ORAL_TABLET | Freq: Every day | ORAL | Status: DC

## 2012-12-10 NOTE — Telephone Encounter (Signed)
Rx Refilled  

## 2012-12-12 ENCOUNTER — Other Ambulatory Visit: Payer: BC Managed Care – PPO

## 2012-12-12 DIAGNOSIS — I10 Essential (primary) hypertension: Secondary | ICD-10-CM

## 2012-12-12 DIAGNOSIS — E038 Other specified hypothyroidism: Secondary | ICD-10-CM

## 2012-12-12 DIAGNOSIS — E039 Hypothyroidism, unspecified: Secondary | ICD-10-CM

## 2012-12-12 LAB — COMPLETE METABOLIC PANEL WITH GFR
Albumin: 4.6 g/dL (ref 3.5–5.2)
Alkaline Phosphatase: 111 U/L (ref 39–117)
BUN: 16 mg/dL (ref 6–23)
Calcium: 10.5 mg/dL (ref 8.4–10.5)
GFR, Est African American: >89 mL/min
GFR, Est Non African American: 84 mL/min
Glucose, Bld: 87 mg/dL (ref 70–99)
Potassium: 4 mEq/L (ref 3.5–5.3)

## 2012-12-12 LAB — CBC WITH DIFFERENTIAL/PLATELET
Basophils Absolute: 0.1 10*3/uL (ref 0.0–0.1)
Lymphocytes Relative: 26 % (ref 12–46)
Neutro Abs: 5.3 10*3/uL (ref 1.7–7.7)
Neutrophils Relative %: 62 % (ref 43–77)
Platelets: 369 10*3/uL (ref 150–400)
RDW: 14.6 % (ref 11.5–15.5)
WBC: 8.6 10*3/uL (ref 4.0–10.5)

## 2012-12-12 LAB — LIPID PANEL
HDL: 47 mg/dL (ref >39)
Triglycerides: 147 mg/dL (ref <150)

## 2013-02-05 ENCOUNTER — Telehealth: Payer: Self-pay | Admitting: Family Medicine

## 2013-02-07 MED ORDER — GABAPENTIN 600 MG PO TABS: 600.0000 mg | ORAL_TABLET | Freq: Three times a day (TID) | ORAL | Status: DC

## 2013-02-07 NOTE — Telephone Encounter (Signed)
Increased med dosage sent to pharm.

## 2013-02-14 ENCOUNTER — Ambulatory Visit: Payer: BC Managed Care – PPO

## 2013-02-27 ENCOUNTER — Ambulatory Visit
Admission: RE | Admit: 2013-02-27 | Discharge: 2013-02-27 | Disposition: A | Discharge status: Home / Self Care | Payer: BC Managed Care – PPO | Source: Ambulatory Visit | Attending: Family | Admitting: Family

## 2013-02-27 DIAGNOSIS — Z853 Personal history of malignant neoplasm of breast: Secondary | ICD-10-CM

## 2013-03-20 ENCOUNTER — Other Ambulatory Visit: Payer: Self-pay

## 2013-04-15 ENCOUNTER — Telehealth: Payer: Self-pay | Admitting: Family Medicine

## 2013-04-15 DIAGNOSIS — E559 Vitamin D deficiency, unspecified: Secondary | ICD-10-CM

## 2013-04-15 DIAGNOSIS — C50919 Malignant neoplasm of unspecified site of unspecified female breast: Secondary | ICD-10-CM

## 2013-04-15 MED ORDER — ZOLPIDEM TARTRATE 10 MG PO TABS: 10.0000 mg | ORAL_TABLET | Freq: Every day | ORAL | Status: DC

## 2013-04-15 NOTE — Telephone Encounter (Signed)
Request RF Zolpidem  Last RF 7/29 #30 +3  Last OV 7/25  OK refill?

## 2013-04-15 NOTE — Telephone Encounter (Signed)
Rx called in 

## 2013-04-15 NOTE — Telephone Encounter (Signed)
ok 

## 2013-07-04 ENCOUNTER — Telehealth: Payer: Self-pay | Admitting: *Deleted

## 2013-07-04 NOTE — Telephone Encounter (Signed)
sw pt informed her that 10/06/13 is a holiday. gv appt for 10/09/13@ 9:30am. Pt is aware...td

## 2013-07-15 ENCOUNTER — Telehealth: Payer: Self-pay | Admitting: Family Medicine

## 2013-07-15 DIAGNOSIS — C50919 Malignant neoplasm of unspecified site of unspecified female breast: Secondary | ICD-10-CM

## 2013-07-15 DIAGNOSIS — E559 Vitamin D deficiency, unspecified: Secondary | ICD-10-CM

## 2013-07-15 MED ORDER — ZOLPIDEM TARTRATE 10 MG PO TABS: 10.0000 mg | ORAL_TABLET | Freq: Every day | ORAL | Status: DC

## 2013-07-15 NOTE — Telephone Encounter (Signed)
ok 

## 2013-07-15 NOTE — Telephone Encounter (Signed)
Last RF Zolpidem 12/02 #30 + 2  Last OV 12/06/12  OK refill?

## 2013-07-15 NOTE — Telephone Encounter (Signed)
Med phoned in °

## 2013-08-13 ENCOUNTER — Telehealth: Payer: Self-pay | Admitting: Family Medicine

## 2013-08-13 DIAGNOSIS — C50919 Malignant neoplasm of unspecified site of unspecified female breast: Secondary | ICD-10-CM

## 2013-08-13 DIAGNOSIS — E559 Vitamin D deficiency, unspecified: Secondary | ICD-10-CM

## 2013-08-13 MED ORDER — ZOLPIDEM TARTRATE 10 MG PO TABS: 10.0000 mg | ORAL_TABLET | Freq: Every day | ORAL | Status: DC

## 2013-08-13 NOTE — Telephone Encounter (Signed)
Rx Refilled  

## 2013-08-13 NOTE — Telephone Encounter (Signed)
ok 

## 2013-08-13 NOTE — Telephone Encounter (Signed)
Requesting refill on Ambien - ? OK to Refill  

## 2013-09-24 ENCOUNTER — Telehealth: Payer: Self-pay | Admitting: Oncology

## 2013-09-24 ENCOUNTER — Other Ambulatory Visit: Payer: Self-pay | Admitting: *Deleted

## 2013-09-24 MED ORDER — GABAPENTIN 600 MG PO TABS: 600.0000 mg | ORAL_TABLET | Freq: Three times a day (TID) | ORAL | Status: DC

## 2013-09-24 NOTE — Telephone Encounter (Signed)
, °

## 2013-09-24 NOTE — Telephone Encounter (Signed)
Refill appropriate and filled per protocol. 

## 2013-09-29 ENCOUNTER — Encounter: Payer: Self-pay | Admitting: Family Medicine

## 2013-09-29 ENCOUNTER — Other Ambulatory Visit: Payer: BC Managed Care – PPO

## 2013-09-29 DIAGNOSIS — C50919 Malignant neoplasm of unspecified site of unspecified female breast: Secondary | ICD-10-CM

## 2013-09-29 DIAGNOSIS — E039 Hypothyroidism, unspecified: Secondary | ICD-10-CM

## 2013-09-29 DIAGNOSIS — Z79899 Other long term (current) drug therapy: Secondary | ICD-10-CM

## 2013-09-29 DIAGNOSIS — E559 Vitamin D deficiency, unspecified: Secondary | ICD-10-CM

## 2013-09-29 DIAGNOSIS — Z853 Personal history of malignant neoplasm of breast: Secondary | ICD-10-CM

## 2013-09-29 DIAGNOSIS — I10 Essential (primary) hypertension: Secondary | ICD-10-CM

## 2013-09-29 LAB — CBC WITH DIFFERENTIAL/PLATELET
BASOS ABS: 0.1 10*3/uL (ref 0.0–0.1)
BASOS PCT: 1 % (ref 0–1)
Eosinophils Absolute: 0.2 10*3/uL (ref 0.0–0.7)
Eosinophils Relative: 2 % (ref 0–5)
HCT: 40.1 % (ref 36.0–46.0)
Hemoglobin: 13.9 g/dL (ref 12.0–15.0)
Lymphocytes Relative: 30 % (ref 12–46)
Lymphs Abs: 2.6 10*3/uL (ref 0.7–4.0)
MCH: 29.1 pg (ref 26.0–34.0)
MCHC: 34.7 g/dL (ref 30.0–36.0)
MCV: 83.9 fL (ref 78.0–100.0)
Monocytes Absolute: 0.9 10*3/uL (ref 0.1–1.0)
Monocytes Relative: 10 % (ref 3–12)
NEUTROS ABS: 5 10*3/uL (ref 1.7–7.7)
Neutrophils Relative %: 57 % (ref 43–77)
PLATELETS: 323 10*3/uL (ref 150–400)
RBC: 4.78 MIL/uL (ref 3.87–5.11)
RDW: 14.4 % (ref 11.5–15.5)
WBC: 8.7 10*3/uL (ref 4.0–10.5)

## 2013-09-29 LAB — COMPLETE METABOLIC PANEL WITH GFR
ALBUMIN: 4.5 g/dL (ref 3.5–5.2)
ALT: 20 U/L (ref 0–35)
AST: 18 U/L (ref 0–37)
Alkaline Phosphatase: 105 U/L (ref 39–117)
BUN: 19 mg/dL (ref 6–23)
CO2: 23 mEq/L (ref 19–32)
Calcium: 10.3 mg/dL (ref 8.4–10.5)
Chloride: 102 mEq/L (ref 96–112)
Creat: 0.9 mg/dL (ref 0.50–1.10)
GFR, Est African American: 87 mL/min
GFR, Est Non African American: 76 mL/min
Glucose, Bld: 98 mg/dL (ref 70–99)
POTASSIUM: 4 meq/L (ref 3.5–5.3)
SODIUM: 139 meq/L (ref 135–145)
TOTAL PROTEIN: 7.7 g/dL (ref 6.0–8.3)
Total Bilirubin: 0.7 mg/dL (ref 0.2–1.2)

## 2013-09-29 LAB — LIPID PANEL
Cholesterol: 206 mg/dL — ABNORMAL HIGH (ref 0–200)
HDL: 46 mg/dL (ref >39)
LDL Cholesterol: 140 mg/dL — ABNORMAL HIGH (ref 0–99)
Total CHOL/HDL Ratio: 4.5 Ratio
Triglycerides: 101 mg/dL (ref <150)
VLDL: 20 mg/dL (ref 0–40)

## 2013-09-29 LAB — TSH: TSH: 6.488 u[IU]/mL — ABNORMAL HIGH (ref 0.350–4.500)

## 2013-09-29 NOTE — Progress Notes (Signed)
Patient ID: [name], female   DOB: [birth year], 48 y.o.   MRN: [id number] Pt came in today for fasting labs of lipid, TSH, and Vit D [name] from Lab accidentally released orders from another doctor and stated I needed to order CBC and CMP. I ordered these labs today and pt had them drawn from [place]. [name] is to call the ordering doctor for CMP and CBC and let them know.

## 2013-09-30 LAB — VITAMIN D 25 HYDROXY (VIT D DEFICIENCY, FRACTURES): Vit D, 25-Hydroxy: 83 ng/mL (ref 30–89)

## 2013-10-03 ENCOUNTER — Ambulatory Visit (INDEPENDENT_AMBULATORY_CARE_PROVIDER_SITE_OTHER): Payer: BC Managed Care – PPO | Admitting: Family Medicine

## 2013-10-03 ENCOUNTER — Encounter: Payer: Self-pay | Admitting: Family Medicine

## 2013-10-03 VITALS — BP 120/80 | HR 78 | Temp 97.0°F | Resp 18 | Ht 61.0 in | Wt 193.0 lb

## 2013-10-03 DIAGNOSIS — Z0000 Encounter for general adult medical examination without abnormal findings: Secondary | ICD-10-CM

## 2013-10-03 DIAGNOSIS — Z23 Encounter for immunization: Secondary | ICD-10-CM

## 2013-10-03 NOTE — Progress Notes (Signed)
Subjective:    Patient ID: [name], female    DOB: Dec 30, 1965, 48 y.o.   MRN: [id number]  HPI Patient today for complete physical exam. She has no medical concerns. Her mammogram was performed last fall and is up to date. Her last Pap smear was 2 years ago. She is due for Pap smear this year but her gynecologist. Her bone density test is performed through her gynecologist.  She is overdue for a TdaP.  Her most recent labwork as listed below: Lab on 09/29/2013  Component Date Value Ref Range Status  . Cholesterol 09/29/2013 206* 0 - 200 mg/dL Final   Comment: ATP III Classification:                                < 200        mg/dL        Desirable                               200 - 239     mg/dL        Borderline High                               >= 240        mg/dL        High                             . Triglycerides 09/29/2013 101  <150 mg/dL Final  . HDL 09/29/2013 46  >39 mg/dL Final  . Total CHOL/HDL Ratio 09/29/2013 4.5   Final  . VLDL 09/29/2013 20  0 - 40 mg/dL Final  . LDL Cholesterol 09/29/2013 140* 0 - 99 mg/dL Final   Comment:                            Total Cholesterol/HDL Ratio:CHD Risk                                                 Coronary Heart Disease Risk Table                                                                 Men       Women                                   1/2 Average Risk              3.4        3.3                                       Average Risk  5.0        4.4                                    2X Average Risk              9.6        7.1                                    3X Average Risk             23.4       11.0                          Use the calculated Patient Ratio above and the CHD Risk table                           to determine the patient's CHD Risk.                          ATP III Classification (LDL):                                < 100        mg/dL         Optimal                               100  - 129     mg/dL         Near or Above Optimal                               130 - 159     mg/dL         Borderline High                               160 - 189     mg/dL         High                                > 190        mg/dL         Very High                             . TSH 09/29/2013 6.488* 0.350 - 4.500 uIU/mL Final  . WBC 09/29/2013 8.7  4.0 - 10.5 K/uL Final  . RBC 09/29/2013 4.78  3.87 - 5.11 MIL/uL Final  . Hemoglobin 09/29/2013 13.9  12.0 - 15.0 g/dL Final  . HCT 09/29/2013 40.1  36.0 - 46.0 % Final  . MCV 09/29/2013 83.9  78.0 - 100.0 fL Final  . MCH 09/29/2013 29.1  26.0 - 34.0 pg Final  . MCHC 09/29/2013 34.7  30.0 - 36.0 g/dL Final  . RDW 09/29/2013 14.4  11.5 - 15.5 % Final  . Platelets 09/29/2013  323  150 - 400 K/uL Final  . Neutrophils Relative % 09/29/2013 57  43 - 77 % Final  . Neutro Abs 09/29/2013 5.0  1.7 - 7.7 K/uL Final  . Lymphocytes Relative 09/29/2013 30  12 - 46 % Final  . Lymphs Abs 09/29/2013 2.6  0.7 - 4.0 K/uL Final  . Monocytes Relative 09/29/2013 10  3 - 12 % Final  . Monocytes Absolute 09/29/2013 0.9  0.1 - 1.0 K/uL Final  . Eosinophils Relative 09/29/2013 2  0 - 5 % Final  . Eosinophils Absolute 09/29/2013 0.2  0.0 - 0.7 K/uL Final  . Basophils Relative 09/29/2013 1  0 - 1 % Final  . Basophils Absolute 09/29/2013 0.1  0.0 - 0.1 K/uL Final  . Smear Review 09/29/2013 Criteria for review not met   Final  . Sodium 09/29/2013 139  135 - 145 mEq/L Final  . Potassium 09/29/2013 4.0  3.5 - 5.3 mEq/L Final  . Chloride 09/29/2013 102  96 - 112 mEq/L Final  . CO2 09/29/2013 23  19 - 32 mEq/L Final  . Glucose, Bld 09/29/2013 98  70 - 99 mg/dL Final  . BUN 09/29/2013 19  6 - 23 mg/dL Final  . Creat 09/29/2013 0.90  0.50 - 1.10 mg/dL Final  . Total Bilirubin 09/29/2013 0.7  0.2 - 1.2 mg/dL Final  . Alkaline Phosphatase 09/29/2013 105  39 - 117 U/L Final  . AST 09/29/2013 18  0 - 37 U/L Final  . ALT 09/29/2013 20  0 - 35 U/L Final  . Total Protein  09/29/2013 7.7  6.0 - 8.3 g/dL Final  . Albumin 09/29/2013 4.5  3.5 - 5.2 g/dL Final  . Calcium 09/29/2013 10.3  8.4 - 10.5 mg/dL Final  . GFR, Est African American 09/29/2013 87   Final  . GFR, Est Non African American 09/29/2013 76   Final   Comment:                            The estimated GFR is a calculation valid for adults (>=61 years old)                          that uses the CKD-EPI algorithm to adjust for age and sex. It is                            not to be used for children, pregnant women, hospitalized patients,                             patients on dialysis, or with rapidly changing kidney function.                          According to the NKDEP, eGFR >89 is normal, 60-89 shows mild                          impairment, 30-59 shows moderate impairment, 15-29 shows severe                          impairment and <15 is ESRD.                             Marland Kitchen  Vit D, 25-Hydroxy 09/29/2013 83  30 - 89 ng/mL Final   Comment: This assay accurately quantifies Vitamin D, which is the sum of the                          25-Hydroxy forms of Vitamin D2 and D3.  Studies have shown that the                          optimum concentration of 25-Hydroxy Vitamin D is 30 ng/mL or higher.                           Concentrations of Vitamin D between 20 and 29 ng/mL are considered to                          be insufficient and concentrations less than 20 ng/mL are considered                          to be deficient for Vitamin D.   Past Medical History  Diagnosis Date  . Breast cancer 12/2005    Right breast  . Cancer 02/2007    Renal cell carcinoma - Left kidney  . Insomnia   . Fibromyalgia   . Ruptured disc, cervical   . Hypertension    Current Outpatient Prescriptions on File Prior to Visit  Medication Sig Dispense Refill  . benazepril-hydrochlorthiazide (LOTENSIN HCT) 20-12.5 MG per tablet Take 1 tablet by mouth daily.  30 tablet  11  . cholecalciferol (VITAMIN D) 1000 UNITS tablet  Take 5,000 Units by mouth daily.        Marland Kitchen gabapentin (NEURONTIN) 600 MG tablet Take 1 tablet (600 mg total) by mouth 3 (three) times daily.  90 tablet  3  . letrozole (FEMARA) 2.5 MG tablet Take 1 tablet (2.5 mg total) by mouth daily.  90 tablet  8  . zolpidem (AMBIEN) 10 MG tablet Take 1 tablet (10 mg total) by mouth at bedtime.  30 tablet  2   No current facility-administered medications on file prior to visit.   Allergies  Allergen Reactions  . Codeine Nausea And Vomiting   History   Social History  . Marital Status: Married    Spouse Name: N/A    Number of Children: N/A  . Years of Education: N/A   Occupational History  . Not on file.   Social History Main Topics  . Smoking status: Former Smoker    Types: Cigarettes  . Smokeless tobacco: Never Used     Comment: Quit over 20 years ago  . Alcohol Use: No  . Drug Use: No  . Sexual Activity: Yes    Birth Control/ Protection: Surgical   Other Topics Concern  . Not on file   Social History Narrative  . No narrative on file   Family History  Problem Relation Age of Onset  . Hypertension Mother   . Diabetes Father   . Stroke Father   . Hypertension Brother   . Cancer Maternal Aunt     Ovarian cancer  . Cancer Maternal Uncle     Colon Cancer  . Cancer Paternal Uncle     Brain cancer  . Cancer Maternal Grandmother     Unknown cancer origin  . Hypertension Brother   . Cancer Maternal Aunt  Colon Cancer      Review of Systems  All other systems reviewed and are negative.      Objective:   Physical Exam  Vitals reviewed. Constitutional: She is oriented to person, place, and time. She appears well-developed and well-nourished. No distress.  HENT:  Head: Normocephalic and atraumatic.  Right Ear: External ear normal.  Left Ear: External ear normal.  Nose: Nose normal.  Mouth/Throat: Oropharynx is clear and moist. No oropharyngeal exudate.  Eyes: Conjunctivae and EOM are normal. Pupils are equal, round,  and reactive to light. Right eye exhibits no discharge. Left eye exhibits no discharge. No scleral icterus.  Neck: Normal range of motion. Neck supple. No JVD present. No thyromegaly present.  Cardiovascular: Normal rate, regular rhythm and normal heart sounds.  Exam reveals no gallop and no friction rub.   No murmur heard. Pulmonary/Chest: Effort normal and breath sounds normal. No respiratory distress. She has no wheezes. She has no rales. She exhibits no tenderness.  Abdominal: Soft. Bowel sounds are normal. She exhibits no distension and no mass. There is no tenderness. There is no rebound and no guarding.  Musculoskeletal: Normal range of motion. She exhibits no edema and no tenderness.  Lymphadenopathy:    She has no cervical adenopathy.  Neurological: She is alert and oriented to person, place, and time. She has normal reflexes. She displays normal reflexes. No cranial nerve deficit. She exhibits normal muscle tone. Coordination normal.  Skin: Skin is warm. No rash noted. She is not diaphoretic. No erythema. No pallor.  Psychiatric: She has a normal mood and affect. Her behavior is normal. Judgment and thought content normal.          Assessment & Plan:  1. Need for prophylactic vaccination and inoculation against unspecified single disease - Tdap vaccine greater than or equal to 7yo IM  2. Routine general medical examination at a health care facility Exam is normal except for her weight. I recommended diet exercise and weight loss. Her cholesterol was also borderline high. I recommended a low saturated fat diet to address her cholesterol. Recheck cholesterol in 6 months. Patient also has subclinical hypothyroidism. We discussed her options and at the present time she elects to recheck a TSH in 6 months as she is presently asymptomatic.

## 2013-10-06 ENCOUNTER — Ambulatory Visit: Payer: BC Managed Care – PPO | Admitting: Oncology

## 2013-10-09 ENCOUNTER — Ambulatory Visit: Payer: BC Managed Care – PPO | Admitting: Oncology

## 2013-10-22 ENCOUNTER — Other Ambulatory Visit: Payer: Self-pay | Admitting: *Deleted

## 2013-10-22 DIAGNOSIS — C50919 Malignant neoplasm of unspecified site of unspecified female breast: Secondary | ICD-10-CM

## 2013-10-22 MED ORDER — LETROZOLE 2.5 MG PO TABS: 2.5000 mg | ORAL_TABLET | Freq: Every day | ORAL | Status: DC

## 2013-11-18 ENCOUNTER — Other Ambulatory Visit: Payer: Self-pay | Admitting: Family Medicine

## 2013-11-18 DIAGNOSIS — E559 Vitamin D deficiency, unspecified: Secondary | ICD-10-CM

## 2013-11-18 DIAGNOSIS — C50919 Malignant neoplasm of unspecified site of unspecified female breast: Secondary | ICD-10-CM

## 2013-11-18 MED ORDER — ZOLPIDEM TARTRATE 10 MG PO TABS: 10.0000 mg | ORAL_TABLET | Freq: Every day | ORAL | Status: DC

## 2013-11-18 NOTE — Telephone Encounter (Signed)
Requesting refill on Ambien - Per [facility] ok - Med called to pharm.

## 2013-12-04 ENCOUNTER — Telehealth: Payer: Self-pay | Admitting: *Deleted

## 2013-12-04 MED ORDER — BENAZEPRIL-HYDROCHLOROTHIAZIDE 20-12.5 MG PO TABS: 1.0000 | ORAL_TABLET | Freq: Every day | ORAL | Status: DC

## 2013-12-04 NOTE — Telephone Encounter (Signed)
Pt called needing refill on Benezapril/HCTZ, refilled medication and pt was made aware script filled

## 2013-12-05 ENCOUNTER — Telehealth: Payer: Self-pay | Admitting: Family Medicine

## 2013-12-05 MED ORDER — BENAZEPRIL-HYDROCHLOROTHIAZIDE 20-12.5 MG PO TABS: 1.0000 | ORAL_TABLET | Freq: Every day | ORAL | Status: DC

## 2013-12-05 NOTE — Telephone Encounter (Signed)
Medication refilled per protocol. 

## 2014-02-17 ENCOUNTER — Other Ambulatory Visit: Payer: Self-pay | Admitting: Emergency Medicine

## 2014-02-17 ENCOUNTER — Other Ambulatory Visit (HOSPITAL_BASED_OUTPATIENT_CLINIC_OR_DEPARTMENT_OTHER): Payer: BC Managed Care – PPO

## 2014-02-17 ENCOUNTER — Telehealth: Payer: Self-pay | Admitting: Family Medicine

## 2014-02-17 DIAGNOSIS — C50919 Malignant neoplasm of unspecified site of unspecified female breast: Secondary | ICD-10-CM

## 2014-02-17 DIAGNOSIS — E559 Vitamin D deficiency, unspecified: Secondary | ICD-10-CM

## 2014-02-17 DIAGNOSIS — G4700 Insomnia, unspecified: Secondary | ICD-10-CM

## 2014-02-17 DIAGNOSIS — C50219 Malignant neoplasm of upper-inner quadrant of unspecified female breast: Secondary | ICD-10-CM

## 2014-02-17 LAB — CBC WITH DIFFERENTIAL/PLATELET
BASO%: 1 % (ref 0.0–2.0)
Basophils Absolute: 0.1 10*3/uL (ref 0.0–0.1)
EOS ABS: 0.2 10*3/uL (ref 0.0–0.5)
EOS%: 1.4 % (ref 0.0–7.0)
HEMATOCRIT: 42.4 % (ref 34.8–46.6)
HGB: 13.9 g/dL (ref 11.6–15.9)
LYMPH%: 24.6 % (ref 14.0–49.7)
MCH: 28.7 pg (ref 25.1–34.0)
MCHC: 32.7 g/dL (ref 31.5–36.0)
MCV: 87.7 fL (ref 79.5–101.0)
MONO#: 1.1 10*3/uL — AB (ref 0.1–0.9)
MONO%: 8.9 % (ref 0.0–14.0)
NEUT%: 64.1 % (ref 38.4–76.8)
NEUTROS ABS: 8 10*3/uL — AB (ref 1.5–6.5)
Platelets: 345 10*3/uL (ref 145–400)
RBC: 4.83 10*6/uL (ref 3.70–5.45)
RDW: 13.7 % (ref 11.2–14.5)
WBC: 12.5 10*3/uL — AB (ref 3.9–10.3)
lymph#: 3.1 10*3/uL (ref 0.9–3.3)

## 2014-02-17 LAB — COMPREHENSIVE METABOLIC PANEL (CC13)
ALT: 29 U/L (ref 0–55)
ANION GAP: 11 meq/L (ref 3–11)
AST: 22 U/L (ref 5–34)
Albumin: 4.2 g/dL (ref 3.5–5.0)
Alkaline Phosphatase: 142 U/L (ref 40–150)
BUN: 12 mg/dL (ref 7.0–26.0)
CALCIUM: 10.5 mg/dL — AB (ref 8.4–10.4)
CHLORIDE: 103 meq/L (ref 98–109)
CO2: 25 meq/L (ref 22–29)
Creatinine: 0.8 mg/dL (ref 0.6–1.1)
Glucose: 84 mg/dl (ref 70–140)
Potassium: 3.5 mEq/L (ref 3.5–5.1)
SODIUM: 139 meq/L (ref 136–145)
TOTAL PROTEIN: 8.3 g/dL (ref 6.4–8.3)
Total Bilirubin: 0.64 mg/dL (ref 0.20–1.20)

## 2014-02-17 NOTE — Telephone Encounter (Signed)
Patient is calling to say that pharmacy [facility] on [address] has sent over fax for her [name] and they have not heard back from [place], says she sometimes has problems with this because the pharmacy cannot get things to go through to [place]  [phone]

## 2014-02-18 MED ORDER — ZOLPIDEM TARTRATE 10 MG PO TABS: 10.0000 mg | ORAL_TABLET | Freq: Every day | ORAL | Status: DC

## 2014-02-18 NOTE — Telephone Encounter (Signed)
MBD ok's medication and med called to pharm and pt aware.

## 2014-02-19 ENCOUNTER — Other Ambulatory Visit: Payer: Self-pay | Admitting: *Deleted

## 2014-02-19 NOTE — Telephone Encounter (Signed)
Received fax requesting refill on Ambien.   Prescription called to pharmacy on 02/18/2014.

## 2014-02-24 ENCOUNTER — Other Ambulatory Visit: Payer: BC Managed Care – PPO

## 2014-02-24 ENCOUNTER — Ambulatory Visit (HOSPITAL_BASED_OUTPATIENT_CLINIC_OR_DEPARTMENT_OTHER): Payer: BC Managed Care – PPO | Admitting: Oncology

## 2014-02-24 VITALS — BP 130/70 | HR 103 | Temp 99.0°F | Resp 20 | Ht 61.0 in | Wt 193.2 lb

## 2014-02-24 DIAGNOSIS — C50912 Malignant neoplasm of unspecified site of left female breast: Secondary | ICD-10-CM

## 2014-02-24 DIAGNOSIS — Z170 Estrogen receptor positive status [ER+]: Secondary | ICD-10-CM

## 2014-02-24 DIAGNOSIS — Z79811 Long term (current) use of aromatase inhibitors: Secondary | ICD-10-CM

## 2014-02-24 DIAGNOSIS — C50911 Malignant neoplasm of unspecified site of right female breast: Secondary | ICD-10-CM

## 2014-02-24 NOTE — Progress Notes (Signed)
[name]  [phone] Fax:([phone]) [phone]  OFFICE PROGRESS NOTE   ID: [name]   DOB: 07/03/1965  MR#: [id number]  NGE#:[id number]   PCP: Odette Fraction, [name], [name], [name]  HISTORY OF PRESENT ILLNESS: From Dr. Collier Salina Rubin's new patient evaluation note dated 03/07/2006:  "This is a pleasant 48 year old woman from [facility] referred by Dr. Brantley Stage for evaluation and treatment of breast cancer.  This woman has been in reasonably good health.  She noted a breast mass on June of this year and by August this area had become tender.  Bilateral diagnostic mammogram with right breast ultrasound was performed on 12/25/2005 and physical examination showed discrete thickening in right breast most pronounced at 12'o clock.  Ultrasound showed diffuse shadow in the breast most pronounced at 12'o clock.  An ultrasound and core biopsy was recommended.  Biopsy performed on 12/25/2005 showed invasive mammary carcinoma.  Lobular features were identified.  The tumor was strongly ER and PR positive at 94% and 98% respectively.  Tumor was HER2/neu 1+, FISH negative, and with a proliferative index of 11%.  MRI scan done on 01/04/2006 showed an enhancing mass in the upper inner quadrant of the right breast measuring 4.9 x 2.9 x 2.8 cm.  Lesion involves the subareolar area without directly involving the right nipple.  Images of the left breast show small intramammary lymph node at the lateral aspect of the breast.  No other abnormalities were seen.  After some discussion, the patient elected to undergo a mastectomy, which occurred on 02/02/2006.  Final pathology showed a 4.5 cm grade 2/3 ductal cancer, with vascular invasion was seen.  Surgical margins were clear.  A total of 5 sentinel lymph nodes were identified, two of which had metastatic carcinoma.  Postoperative course has been complicated by a port impaction and so her port has been removed.  She  has been on antibiotics, I believe she was on amoxicillin and now on doxycycline.  At the time of her port placement, she was thought to have an irregular heart rate."    Her subsequent history is as detailed below.  INTERVAL HISTORY: [name] returns today for followup of her breast cancer. The interval history is generally unremarkable. She continues on letrozole, which she is tolerating well. Hot flashes and vaginal dryness are not major issues for her.  REVIEW OF SYSTEMS: She has significant fibromyalgia. She does not think the letrozole has made that any worse. She takes Neurontin and Ambien for that. She walks for exercise. 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 She had what she thinks may have been shingles in her right shoulder but that has resolved. A detailed review of systems today was otherwise stable  PAST MEDICAL HISTORY: Past Medical History  Diagnosis Date  . Breast cancer 12/2005    Right breast  . Cancer 02/2007    Renal cell carcinoma - Left kidney  . Insomnia   . Fibromyalgia   . Ruptured disc, cervical   . Hypertension   History of fibromyalgia with restless legs syndrome.  PAST SURGICAL HISTORY: Past Surgical History  Procedure Laterality Date  . Mastectomy Right 02/02/2006  . Partial nephrectomy Left 06/11/2006  . Cesarean section      C-section x 3  . Cervical disc surgery    . Salpingoophorectomy    . Port-a-cath removal    . Knee arthroscopy Right 1998  Includes, neck surgery  in March 2003, kidney stone lithotripsy in 2003, 1999 and 1998, and three cesarean sections, and knee surgery in 1998.  FAMILY HISTORY Family History  Problem Relation Age of Onset  . Hypertension Mother   . Diabetes Father   . Stroke Father   . Hypertension Brother   . Cancer Maternal Aunt     Ovarian cancer  . Cancer Maternal Uncle     Colon Cancer  . Cancer Paternal Uncle     Brain cancer  . Cancer Maternal Grandmother      Unknown cancer origin  . Hypertension Brother   . Cancer Maternal Aunt     Colon Cancer  Two brothers and two sisters in good health.  Mother who is with her today is in good health.  There is a history of ovarian cancer in the maternal aunt and maternal grandmother may have had some form of intestinal cancer.   GYNECOLOGIC HISTORY: She is gravida 5, para 3 with two miscarriages. She has a history of menometrorrhagia.   SOCIAL HISTORY:  her husband Elenore Rota  is a Theme park manager.  They've been married for 28 years.  They have 3 adult children, 2 daughters and one son. One of her daughters and her son live at home with her and her husband. 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  In her spare time she likes to read, go to church, and cook.   ADVANCED DIRECTIVES: Not on file  HEALTH MAINTENANCE: History  Substance Use Topics  . Smoking status: Former Smoker    Types: Cigarettes  . Smokeless tobacco: Never Used     Comment: Quit over 20 years ago  . Alcohol Use: No    Colonoscopy: N/A PAP: Not on file Bone density: Last bone density scan on 02/14/2012 showed a T score of -0.8  (normal). Lipid panel: Not on file  Allergies  Allergen Reactions  . Codeine Nausea And Vomiting    Current Outpatient Prescriptions  Medication Sig Dispense Refill  . benazepril-hydrochlorthiazide (LOTENSIN HCT) 20-12.5 MG per tablet Take 1 tablet by mouth daily.  30 tablet  5  . cholecalciferol (VITAMIN D) 1000 UNITS tablet Take 5,000 Units by mouth daily.        Marland Kitchen gabapentin (NEURONTIN) 600 MG tablet Take 1 tablet (600 mg total) by mouth 3 (three) times daily.  90 tablet  3  . letrozole (FEMARA) 2.5 MG tablet Take 1 tablet (2.5 mg total) by mouth daily.  90 tablet  3  . zolpidem (AMBIEN) 10 MG tablet Take 1 tablet (10 mg total) by mouth at bedtime.  30 tablet  2   No current facility-administered medications for this visit.     OBJECTIVE: Middle-aged white woman in no acute distress  Filed Vitals:   02/24/14 1540  BP: 130/70  Pulse: 103  Temp: 99 F (37.2 C)  Resp: 20     Body mass index is 36.52 kg/(m^2).      ECOG FS: 1 - Symptomatic but completely ambulatory  Sclerae unicteric, pupils equal and reactive Oropharynx clear and moist-- no thrush No cervical or supraclavicular adenopathy Lungs no rales or rhonchi Heart regular rate and rhythm Abd soft, nontender, positive bowel sounds MSK no focal spinal tenderness, no upper extremity lymphedema Neuro: nonfocal, well oriented, appropriate affect Breasts: The right breast is status post mastectomy. There is no evidence of chest wall recurrence. The right axilla is benign. The left breast is unremarkable.  LAB RESULTS: Lab Results  Component Value Date   WBC 12.5* 02/17/2014   NEUTROABS 8.0* 02/17/2014   HGB 13.9 02/17/2014   HCT 42.4 02/17/2014   MCV 87.7 02/17/2014   PLT 345 02/17/2014      Chemistry      Component Value Date/Time   NA 139 02/17/2014 1456   NA 139 09/29/2013 1103   K 3.5 02/17/2014 1456   K 4.0 09/29/2013 1103   CL 102 09/29/2013 1103   CL 105 09/30/2012 1207   CO2 25 02/17/2014 1456   CO2 23 09/29/2013 1103   BUN 12.0 02/17/2014 1456   BUN 19 09/29/2013 1103   CREATININE 0.8 02/17/2014 1456   CREATININE 0.90 09/29/2013 1103   CREATININE 0.76 10/31/2011 1345      Component Value Date/Time   CALCIUM 10.5* 02/17/2014 1456   CALCIUM 10.3 09/29/2013 1103   ALKPHOS 142 02/17/2014 1456   ALKPHOS 105 09/29/2013 1103   AST 22 02/17/2014 1456   AST 18 09/29/2013 1103   ALT 29 02/17/2014 1456   ALT 20 09/29/2013 1103   BILITOT 0.64 02/17/2014 1456   BILITOT 0.7 09/29/2013 1103       Lab Results  Component Value Date   LABCA2 17 10/31/2011    Urinalysis No results found for this basename: colorurine,  appearanceur,  labspec,  phurine,  glucoseu,  hgbur,  bilirubinur,  ketonesur,  proteinur,  urobilinogen,  nitrite,  leukocytesur     STUDIES: Left mammogram is due later this month, as is a repeat bone density.  ASSESSMENT: 48 y.o. [place], [place] woman:  1.  .  Status post right breast mastectomy with sentinel node biopsy on 02/02/2006, for a stage IIB, pT2 pN1, 4.5 cm invasive ductal carcinoma with lobular features grade 2, ductal carcinoma in situ, ER 94%, PR 90%, Ki-67 11%, HER-2/neu by FISH no amplification, 2/5 positive metastatic lymph nodes.  2.  Status post right axillary regional resection of lymph nodes on 03/27/2006 which showed 2/13 metastatic lymph nodes.  3.  History of renal cell carcinoma status post partial nephrectomy on 06/11/2006.    4. Status post bilateral salpingo-oophorectomy on 06/11/2006 also.  5.  Status post adjuvant chemotherapy with FEC x 3 cycles with Neulasta support from 07/02/2006 through 08/13/2006.  This was followed by adjuvant chemotherapy with Taxotere with Neulasta support x 3 cycles from 09/03/2006 through 10/15/2006.    6.  Status post radiation therapy from 11/06/2006 through 12/21/2006.  7.  The patient started antiestrogen therapy with Arimidex from 12/2006 until 01/2008.  The patient was then switched to antiestrogen therapy with Femara in 01/2008.  PLAN: Jozelyn is doing very well as far as her breast cancer is concerned, now 8 years out from her definitive surgery with no evidence of disease recurrence. We discussed the fact that the data for letrozole runs out at 5 years for historical reasons. We do have one study in which women like her were randomized to 5 years of aromatase inhibitor versus observation. That study closed about 3-4 years ago but has not yet been reported.  On the other hand she is a young woman whom I would prefer to treat for 10 years and certainly we do not have any data that we should not continue letrozole.  Given this discussion her preference is to continue the letrozole as before until we complete 8 years of antiestrogen therapy  which is going to take [place] to 2018. I will see her again in one year.  If there is any significant change in her bone density this year we will discuss bisphosphonates versus denosumab  She has a good understanding of the overall plan. She agrees with that. She knows to goal of treatment in her case is cure. She will call with any problems that may develop before her next visit here.  Chauncey Cruel, MD  02/24/2014, 3:47 PM

## 2014-02-26 DIAGNOSIS — C50911 Malignant neoplasm of unspecified site of right female breast: Secondary | ICD-10-CM | POA: Insufficient documentation

## 2014-02-27 ENCOUNTER — Telehealth: Payer: Self-pay | Admitting: Oncology

## 2014-02-27 NOTE — Telephone Encounter (Signed)
s.w. pt and advised on OCT 2016 appt....pt ok adn aware

## 2014-03-11 ENCOUNTER — Other Ambulatory Visit: Payer: Self-pay | Admitting: Oncology

## 2014-03-11 DIAGNOSIS — E559 Vitamin D deficiency, unspecified: Secondary | ICD-10-CM

## 2014-03-11 DIAGNOSIS — Z1231 Encounter for screening mammogram for malignant neoplasm of breast: Secondary | ICD-10-CM

## 2014-03-11 DIAGNOSIS — Z9011 Acquired absence of right breast and nipple: Secondary | ICD-10-CM

## 2014-04-08 ENCOUNTER — Ambulatory Visit
Admission: RE | Admit: 2014-04-08 | Discharge: 2014-04-08 | Disposition: A | Discharge status: Home / Self Care | Payer: BC Managed Care – PPO | Source: Ambulatory Visit | Attending: Oncology | Admitting: Oncology

## 2014-04-08 DIAGNOSIS — Z9011 Acquired absence of right breast and nipple: Secondary | ICD-10-CM

## 2014-04-08 DIAGNOSIS — Z1231 Encounter for screening mammogram for malignant neoplasm of breast: Secondary | ICD-10-CM

## 2014-05-18 ENCOUNTER — Other Ambulatory Visit: Payer: Self-pay | Admitting: Family Medicine

## 2014-05-18 DIAGNOSIS — G4700 Insomnia, unspecified: Secondary | ICD-10-CM

## 2014-05-18 DIAGNOSIS — E559 Vitamin D deficiency, unspecified: Secondary | ICD-10-CM

## 2014-05-18 NOTE — Telephone Encounter (Signed)
ok 

## 2014-05-18 NOTE — Telephone Encounter (Signed)
Requesting a refill on Ambien - ? OK to Refill

## 2014-05-19 MED ORDER — ZOLPIDEM TARTRATE 10 MG PO TABS: 10.0000 mg | ORAL_TABLET | Freq: Every day | ORAL | Status: DC

## 2014-05-19 NOTE — Telephone Encounter (Signed)
rx called back

## 2014-06-12 ENCOUNTER — Ambulatory Visit (INDEPENDENT_AMBULATORY_CARE_PROVIDER_SITE_OTHER): Payer: BLUE CROSS/BLUE SHIELD | Admitting: Family Medicine

## 2014-06-12 ENCOUNTER — Encounter: Payer: Self-pay | Admitting: Family Medicine

## 2014-06-12 VITALS — BP 138/90 | HR 72 | Temp 98.4°F | Resp 18 | Ht 61.0 in | Wt 188.0 lb

## 2014-06-12 DIAGNOSIS — N6320 Unspecified lump in the left breast, unspecified quadrant: Secondary | ICD-10-CM

## 2014-06-12 DIAGNOSIS — N63 Unspecified lump in breast: Secondary | ICD-10-CM

## 2014-06-12 MED ORDER — GABAPENTIN 600 MG PO TABS: 600.0000 mg | ORAL_TABLET | Freq: Three times a day (TID) | ORAL | Status: DC

## 2014-06-12 NOTE — Progress Notes (Signed)
Patient ID: [name], female   DOB: [birth year], 49 y.o.   MRN: [id number]   Subjective:    Patient ID: [name], female    DOB: [birth year], 49 y.o.   MRN: [id number]  Patient presents for left breast knot and Medication Refill  Patient here with a not she fell on her left breast last night. She is history of breast cancer in the right side she is status post mastectomy is currently on for marrow for treatment. She did have a normal screening mammogram in November 2015 on the left side. She states that she does have some fibrocystic disease but this little knot is new. She's not had any discharge from the nipple no drainage or redness from the area.   Review Of Systems:  GEN- denies fatigue, fever, weight loss,weakness, recent illness HEENT- denies eye drainage, change in vision, nasal discharge, CVS- denies chest pain, palpitations RESP- denies SOB, cough, wheeze ABD- denies N/V, change in stools, abd pain GU- denies dysuria, hematuria, dribbling, incontinence MSK- denies joint pain, muscle aches, injury Neuro- denies headache, dizziness, syncope, seizure activity       Objective:    BP 138/90 mmHg  Pulse 72  Temp(Src) 98.4 F (36.9 C) (Oral)  Resp 18  Ht 5\' 1"  (1.549 m)  Wt 188 lb (85.276 kg)  BMI 35.54 kg/m2 GEN- NAD, alert and oriented, Neck- supple, no thyromegaly Breast- right chest wall s/p mastectomy,  Left breast, no nipple inversion,no nipple drainage, + bb size knot TTP 4 oclock position near nipple, dense breast tissue lower quadrants Nodes- no axillary nodes         Assessment & Plan:      Problem List Items Addressed This Visit    None    Visit Diagnoses    Left breast mass    -  Primary    Pea/BB size knot which is new, diagnostic left breast imaging, ?cystic vs malginancy with history on right side    Relevant Orders    MM Digital Diagnostic Unilat L    US BREAST COMPLETE UNI LEFT INC AXILLA       Note: This dictation was prepared  with Dragon dictation along with smaller phrase technology. Any transcriptional errors that result from this process are unintentional.

## 2014-06-12 NOTE — Patient Instructions (Signed)
Mammogram to be scheduled, we will call with results Meds refilled F/U as needed

## 2014-06-17 ENCOUNTER — Ambulatory Visit
Admission: RE | Admit: 2014-06-17 | Discharge: 2014-06-17 | Disposition: A | Discharge status: Home / Self Care | Payer: BLUE CROSS/BLUE SHIELD | Source: Ambulatory Visit | Attending: Family Medicine | Admitting: Family Medicine

## 2014-06-17 ENCOUNTER — Ambulatory Visit
Admission: RE | Admit: 2014-06-17 | Discharge: 2014-06-17 | Disposition: A | Discharge status: Home / Self Care | Payer: BLUE CROSS/BLUE SHIELD | Source: Ambulatory Visit

## 2014-06-17 DIAGNOSIS — N6320 Unspecified lump in the left breast, unspecified quadrant: Secondary | ICD-10-CM

## 2014-06-18 ENCOUNTER — Other Ambulatory Visit: Payer: Self-pay | Admitting: Family Medicine

## 2014-06-18 DIAGNOSIS — N6320 Unspecified lump in the left breast, unspecified quadrant: Secondary | ICD-10-CM

## 2014-08-19 ENCOUNTER — Telehealth: Payer: Self-pay | Admitting: Family Medicine

## 2014-08-19 DIAGNOSIS — E559 Vitamin D deficiency, unspecified: Secondary | ICD-10-CM

## 2014-08-19 DIAGNOSIS — G4700 Insomnia, unspecified: Secondary | ICD-10-CM

## 2014-08-19 NOTE — Telephone Encounter (Signed)
Requesting a refill on Ambien - ? OK to Refill

## 2014-08-20 MED ORDER — ZOLPIDEM TARTRATE 10 MG PO TABS: 10.0000 mg | ORAL_TABLET | Freq: Every day | ORAL | Status: DC

## 2014-08-20 NOTE — Telephone Encounter (Signed)
Med called to pharm 

## 2014-08-20 NOTE — Telephone Encounter (Signed)
ok 

## 2014-08-20 NOTE — Telephone Encounter (Signed)
Patient calling back regarding her [name] refill  [phone]

## 2014-08-25 ENCOUNTER — Other Ambulatory Visit: Payer: Self-pay | Admitting: Family Medicine

## 2014-08-25 MED ORDER — BENAZEPRIL-HYDROCHLOROTHIAZIDE 20-12.5 MG PO TABS: 1.0000 | ORAL_TABLET | Freq: Every day | ORAL | Status: DC

## 2014-08-25 NOTE — Telephone Encounter (Signed)
Med sent to pharm 

## 2014-10-16 ENCOUNTER — Other Ambulatory Visit: Payer: Self-pay | Admitting: Oncology

## 2014-10-16 NOTE — Telephone Encounter (Signed)
Last OV 02/24/14.  Next OV 03/04/15.  Chart reviewed.

## 2014-11-12 ENCOUNTER — Encounter: Payer: Self-pay | Admitting: Family Medicine

## 2014-11-12 ENCOUNTER — Other Ambulatory Visit: Payer: Self-pay | Admitting: Family Medicine

## 2014-11-12 DIAGNOSIS — E559 Vitamin D deficiency, unspecified: Secondary | ICD-10-CM

## 2014-11-12 DIAGNOSIS — G4700 Insomnia, unspecified: Secondary | ICD-10-CM

## 2014-11-12 MED ORDER — ZOLPIDEM TARTRATE 10 MG PO TABS: 10.0000 mg | ORAL_TABLET | Freq: Every day | ORAL | Status: DC

## 2014-11-12 NOTE — Telephone Encounter (Signed)
Medication refilled per protocol. 

## 2014-11-12 NOTE — Telephone Encounter (Signed)
LRF 08/20/14 #30 + 2.  LOV 06/12/14  OK refill?

## 2014-11-12 NOTE — Telephone Encounter (Signed)
Approved. #30+2. 

## 2014-11-17 ENCOUNTER — Other Ambulatory Visit: Payer: Self-pay | Admitting: Family Medicine

## 2014-11-17 DIAGNOSIS — G4700 Insomnia, unspecified: Secondary | ICD-10-CM

## 2014-11-17 DIAGNOSIS — E559 Vitamin D deficiency, unspecified: Secondary | ICD-10-CM

## 2014-11-17 MED ORDER — ZOLPIDEM TARTRATE 10 MG PO TABS: 10.0000 mg | ORAL_TABLET | Freq: Every day | ORAL | Status: DC

## 2014-11-17 NOTE — Telephone Encounter (Signed)
Ambien from last week called into wrong pharmacy.  Called into correct pharmacy

## 2014-12-02 ENCOUNTER — Other Ambulatory Visit: Payer: BLUE CROSS/BLUE SHIELD

## 2014-12-02 DIAGNOSIS — E559 Vitamin D deficiency, unspecified: Secondary | ICD-10-CM

## 2014-12-02 DIAGNOSIS — E785 Hyperlipidemia, unspecified: Secondary | ICD-10-CM

## 2014-12-02 DIAGNOSIS — I10 Essential (primary) hypertension: Secondary | ICD-10-CM

## 2014-12-02 DIAGNOSIS — Z0000 Encounter for general adult medical examination without abnormal findings: Secondary | ICD-10-CM

## 2014-12-02 DIAGNOSIS — Z79899 Other long term (current) drug therapy: Secondary | ICD-10-CM

## 2014-12-02 LAB — COMPLETE METABOLIC PANEL WITH GFR
ALT: 20 U/L (ref 0–35)
AST: 20 U/L (ref 0–37)
Albumin: 4.5 g/dL (ref 3.5–5.2)
Alkaline Phosphatase: 116 U/L (ref 39–117)
BUN: 12 mg/dL (ref 6–23)
CO2: 22 mEq/L (ref 19–32)
CREATININE: 0.84 mg/dL (ref 0.50–1.10)
Calcium: 10.6 mg/dL — ABNORMAL HIGH (ref 8.4–10.5)
Chloride: 100 mEq/L (ref 96–112)
GFR, EST NON AFRICAN AMERICAN: 82 mL/min
GFR, Est African American: >89 mL/min
Glucose, Bld: 92 mg/dL (ref 70–99)
POTASSIUM: 4.3 meq/L (ref 3.5–5.3)
SODIUM: 141 meq/L (ref 135–145)
Total Bilirubin: 0.6 mg/dL (ref 0.2–1.2)
Total Protein: 8.8 g/dL — ABNORMAL HIGH (ref 6.0–8.3)

## 2014-12-02 LAB — CBC WITH DIFFERENTIAL/PLATELET
BASOS ABS: 0.1 10*3/uL (ref 0.0–0.1)
Basophils Relative: 1 % (ref 0–1)
EOS PCT: 2 % (ref 0–5)
Eosinophils Absolute: 0.2 10*3/uL (ref 0.0–0.7)
HCT: 45.8 % (ref 36.0–46.0)
HEMOGLOBIN: 15.2 g/dL — AB (ref 12.0–15.0)
Lymphocytes Relative: 36 % (ref 12–46)
Lymphs Abs: 3.3 10*3/uL (ref 0.7–4.0)
MCH: 28.5 pg (ref 26.0–34.0)
MCHC: 33.2 g/dL (ref 30.0–36.0)
MCV: 85.8 fL (ref 78.0–100.0)
MPV: 10.1 fL (ref 8.6–12.4)
Monocytes Absolute: 0.9 10*3/uL (ref 0.1–1.0)
Monocytes Relative: 10 % (ref 3–12)
Neutro Abs: 4.7 10*3/uL (ref 1.7–7.7)
Neutrophils Relative %: 51 % (ref 43–77)
Platelets: 351 10*3/uL (ref 150–400)
RBC: 5.34 MIL/uL — AB (ref 3.87–5.11)
RDW: 13.9 % (ref 11.5–15.5)
WBC: 9.2 10*3/uL (ref 4.0–10.5)

## 2014-12-02 LAB — LIPID PANEL
Cholesterol: 276 mg/dL — ABNORMAL HIGH (ref 0–200)
HDL: 46 mg/dL (ref >=46)
LDL Cholesterol: 186 mg/dL — ABNORMAL HIGH (ref 0–99)
Total CHOL/HDL Ratio: 6 Ratio
Triglycerides: 218 mg/dL — ABNORMAL HIGH (ref <150)
VLDL: 44 mg/dL — ABNORMAL HIGH (ref 0–40)

## 2014-12-02 LAB — TSH: TSH: 5.205 u[IU]/mL — AB (ref 0.350–4.500)

## 2014-12-03 LAB — VITAMIN D 25 HYDROXY (VIT D DEFICIENCY, FRACTURES): Vit D, 25-Hydroxy: 55 ng/mL (ref 30–100)

## 2014-12-07 ENCOUNTER — Ambulatory Visit (INDEPENDENT_AMBULATORY_CARE_PROVIDER_SITE_OTHER): Payer: BLUE CROSS/BLUE SHIELD | Admitting: Family Medicine

## 2014-12-07 ENCOUNTER — Encounter: Payer: Self-pay | Admitting: Family Medicine

## 2014-12-07 ENCOUNTER — Encounter: Payer: Self-pay | Admitting: Internal Medicine

## 2014-12-07 VITALS — BP 130/94 | HR 86 | Temp 98.6°F | Resp 16 | Ht 61.0 in | Wt 190.0 lb

## 2014-12-07 DIAGNOSIS — E559 Vitamin D deficiency, unspecified: Secondary | ICD-10-CM | POA: Diagnosis not present

## 2014-12-07 DIAGNOSIS — M549 Dorsalgia, unspecified: Secondary | ICD-10-CM | POA: Diagnosis not present

## 2014-12-07 DIAGNOSIS — E785 Hyperlipidemia, unspecified: Secondary | ICD-10-CM

## 2014-12-07 DIAGNOSIS — Z0000 Encounter for general adult medical examination without abnormal findings: Secondary | ICD-10-CM | POA: Diagnosis not present

## 2014-12-07 DIAGNOSIS — G4700 Insomnia, unspecified: Secondary | ICD-10-CM | POA: Diagnosis not present

## 2014-12-07 DIAGNOSIS — I10 Essential (primary) hypertension: Secondary | ICD-10-CM

## 2014-12-07 MED ORDER — BENAZEPRIL-HYDROCHLOROTHIAZIDE 20-12.5 MG PO TABS: 2.0000 | ORAL_TABLET | Freq: Every day | ORAL | Status: DC

## 2014-12-07 MED ORDER — CYCLOBENZAPRINE HCL 10 MG PO TABS: 10.0000 mg | ORAL_TABLET | Freq: Three times a day (TID) | ORAL | Status: DC | PRN

## 2014-12-07 MED ORDER — ZOLPIDEM TARTRATE 10 MG PO TABS: 10.0000 mg | ORAL_TABLET | Freq: Every day | ORAL | Status: DC

## 2014-12-07 MED ORDER — PRAVASTATIN SODIUM 40 MG PO TABS: 40.0000 mg | ORAL_TABLET | Freq: Every day | ORAL | Status: DC

## 2014-12-07 NOTE — Progress Notes (Signed)
Subjective:    Patient ID: [name], female    DOB: May 21, 1965, 49 y.o.   MRN: [id number]  HPI  Patient is here today for complete physical exam. Her blood pressure has been elevated recently. Her blood pressure typically fluctuates between 1:30 and 150/90-95. She denies any chest pain shortness of breath or dyspnea on exertion. Her mammogram was checked in February and was normal. Breast exam was performed today and is significant for a right total mastectomy. Left breast exam was normal. She is overdue for a Pap smear. Patient will be 80 in September and is due for a screening colonoscopy at that time. She also has a family history of colon cancer in 3 separate maternal aunts and uncle. Her most recent bloodwork is listed below: Lab on 12/02/2014  Component Date Value Ref Range Status  . Sodium 12/02/2014 141  135 - 145 mEq/L Final  . Potassium 12/02/2014 4.3  3.5 - 5.3 mEq/L Final  . Chloride 12/02/2014 100  96 - 112 mEq/L Final  . CO2 12/02/2014 22  19 - 32 mEq/L Final  . Glucose, Bld 12/02/2014 92  70 - 99 mg/dL Final  . BUN 12/02/2014 12  6 - 23 mg/dL Final  . Creat 12/02/2014 0.84  0.50 - 1.10 mg/dL Final  . Total Bilirubin 12/02/2014 0.6  0.2 - 1.2 mg/dL Final  . Alkaline Phosphatase 12/02/2014 116  39 - 117 U/L Final  . AST 12/02/2014 20  0 - 37 U/L Final  . ALT 12/02/2014 20  0 - 35 U/L Final  . Total Protein 12/02/2014 8.8* 6.0 - 8.3 g/dL Final  . Albumin 12/02/2014 4.5  3.5 - 5.2 g/dL Final  . Calcium 12/02/2014 10.6* 8.4 - 10.5 mg/dL Final  . GFR, Est African American 12/02/2014 >89   Final  . GFR, Est Non African American 12/02/2014 82   Final   Comment:   The estimated GFR is a calculation valid for adults (>=46 years old) that uses the CKD-EPI algorithm to adjust for age and sex. It is   not to be used for children, pregnant women, hospitalized patients,    patients on dialysis, or with rapidly changing kidney function. According to the NKDEP, eGFR >89 is  normal, 60-89 shows mild impairment, 30-59 shows moderate impairment, 15-29 shows severe impairment and <15 is ESRD.     . TSH 12/02/2014 5.205* 0.350 - 4.500 uIU/mL Final  . Cholesterol 12/02/2014 276* 0 - 200 mg/dL Final   Comment: ATP III Classification:       < 200        mg/dL        Desirable      200 - 239     mg/dL        Borderline High      >= 240        mg/dL        High     . Triglycerides 12/02/2014 218* <150 mg/dL Final  . HDL 12/02/2014 46  >=46 mg/dL Final  . Total CHOL/HDL Ratio 12/02/2014 6.0   Final  . VLDL 12/02/2014 44* 0 - 40 mg/dL Final  . LDL Cholesterol 12/02/2014 186* 0 - 99 mg/dL Final   Comment:   Total Cholesterol/HDL Ratio:CHD Risk                        Coronary Heart Disease Risk Table  Men       Women          1/2 Average Risk              3.4        3.3              Average Risk              5.0        4.4           2X Average Risk              9.6        7.1           3X Average Risk             23.4       11.0 Use the calculated Patient Ratio above and the CHD Risk table  to determine the patient's CHD Risk. ATP III Classification (LDL):       < 100        mg/dL         Optimal      100 - 129     mg/dL         Near or Above Optimal      130 - 159     mg/dL         Borderline High      160 - 189     mg/dL         High       > 190        mg/dL         Very High     . WBC 12/02/2014 9.2  4.0 - 10.5 K/uL Final  . RBC 12/02/2014 5.34* 3.87 - 5.11 MIL/uL Final  . Hemoglobin 12/02/2014 15.2* 12.0 - 15.0 g/dL Final  . HCT 12/02/2014 45.8  36.0 - 46.0 % Final  . MCV 12/02/2014 85.8  78.0 - 100.0 fL Final  . MCH 12/02/2014 28.5  26.0 - 34.0 pg Final  . MCHC 12/02/2014 33.2  30.0 - 36.0 g/dL Final  . RDW 12/02/2014 13.9  11.5 - 15.5 % Final  . Platelets 12/02/2014 351  150 - 400 K/uL Final  . MPV 12/02/2014 10.1  8.6 - 12.4 fL Final  . Neutrophils Relative % 12/02/2014 51  43 - 77 % Final  . Neutro Abs  12/02/2014 4.7  1.7 - 7.7 K/uL Final  . Lymphocytes Relative 12/02/2014 36  12 - 46 % Final  . Lymphs Abs 12/02/2014 3.3  0.7 - 4.0 K/uL Final  . Monocytes Relative 12/02/2014 10  3 - 12 % Final  . Monocytes Absolute 12/02/2014 0.9  0.1 - 1.0 K/uL Final  . Eosinophils Relative 12/02/2014 2  0 - 5 % Final  . Eosinophils Absolute 12/02/2014 0.2  0.0 - 0.7 K/uL Final  . Basophils Relative 12/02/2014 1  0 - 1 % Final  . Basophils Absolute 12/02/2014 0.1  0.0 - 0.1 K/uL Final  . Smear Review 12/02/2014 Criteria for review not met   Final  . Vit D, 25-Hydroxy 12/02/2014 55  30 - 100 ng/mL Final   Comment: Vitamin D Status           25-OH Vitamin D        Deficiency                <20 ng/mL  Insufficiency         20 - 29 ng/mL        Optimal             > or = 30 ng/mL   For 25-OH Vitamin D testing on patients on D2-supplementation and patients for whom quantitation of D2 and D3 fractions is required, the QuestAssureD 25-OH VIT D, (D2,D3), LC/MS/MS is recommended: order code [phone] [phone] (patients > 2 yrs).    Past Medical History  Diagnosis Date  . Breast cancer 12/2005    Right breast  . Cancer 02/2007    Renal cell carcinoma - Left kidney  . Insomnia   . Fibromyalgia   . Ruptured disc, cervical   . Hypertension    Past Surgical History  Procedure Laterality Date  . Mastectomy Right 02/02/2006  . Partial nephrectomy Left 06/11/2006  . Cesarean section      C-section x 3  . Cervical disc surgery    . Salpingoophorectomy    . Port-a-cath removal    . Knee arthroscopy Right 1998   Current Outpatient Prescriptions on File Prior to Visit  Medication Sig Dispense Refill  . cholecalciferol (VITAMIN D) 1000 UNITS tablet Take 5,000 Units by mouth daily.      Marland Kitchen gabapentin (NEURONTIN) 600 MG tablet Take 1 tablet (600 mg total) by mouth 3 (three) times daily. 90 tablet 3  . letrozole (FEMARA) 2.5 MG tablet TAKE 1 TABLET BY MOUTH DAILY 90 tablet 3   No current facility-administered  medications on file prior to visit.   Allergies  Allergen Reactions  . Codeine Nausea And Vomiting   History   Social History  . Marital Status: Married    Spouse Name: N/A  . Number of Children: N/A  . Years of Education: N/A   Occupational History  . Not on file.   Social History Main Topics  . Smoking status: Former Smoker    Types: Cigarettes  . Smokeless tobacco: Never Used     Comment: Quit over 20 years ago  . Alcohol Use: No  . Drug Use: No  . Sexual Activity: Yes    Birth Control/ Protection: Surgical   Other Topics Concern  . Not on file   Social History Narrative   Family History  Problem Relation Age of Onset  . Hypertension Mother   . Diabetes Father   . Stroke Father   . Hypertension Brother   . Cancer Maternal Aunt     Ovarian cancer  . Cancer Maternal Uncle     Colon Cancer  . Cancer Paternal Uncle     Brain cancer  . Cancer Maternal Grandmother     Unknown cancer origin  . Hypertension Brother   . Cancer Maternal Aunt     Colon Cancer     Review of Systems  All other systems reviewed and are negative.      Objective:   Physical Exam  Constitutional: She is oriented to person, place, and time. She appears well-developed and well-nourished. No distress.  HENT:  Head: Normocephalic and atraumatic.  Right Ear: External ear normal.  Left Ear: External ear normal.  Nose: Nose normal.  Mouth/Throat: Oropharynx is clear and moist. No oropharyngeal exudate.  Eyes: Conjunctivae and EOM are normal. Pupils are equal, round, and reactive to light. Right eye exhibits no discharge. Left eye exhibits no discharge. No scleral icterus.  Neck: Normal range of motion. Neck supple. No JVD present. No tracheal deviation present. No thyromegaly present.  Cardiovascular: Normal rate, regular rhythm, normal heart sounds and intact distal pulses.  Exam reveals no gallop and no friction rub.   No murmur heard. Pulmonary/Chest: Effort normal and breath  sounds normal. No stridor. No respiratory distress. She has no wheezes. She has no rales. She exhibits no tenderness.  Abdominal: Soft. Bowel sounds are normal. She exhibits no distension and no mass. There is no tenderness. There is no rebound and no guarding.  Genitourinary: Vagina normal and uterus normal.  Musculoskeletal: Normal range of motion. She exhibits no edema or tenderness.  Lymphadenopathy:    She has no cervical adenopathy.  Neurological: She is alert and oriented to person, place, and time. She has normal reflexes. She displays normal reflexes. No cranial nerve deficit. She exhibits normal muscle tone. Coordination normal.  Skin: Skin is warm. No rash noted. She is not diaphoretic. No erythema. No pallor.  Psychiatric: She has a normal mood and affect. Her behavior is normal. Judgment and thought content normal.  Vitals reviewed.  Patient has a very narrow introitus       Assessment & Plan:  Mid-back pain, acute - Plan: DG Thoracic Spine W/Swimmers, cyclobenzaprine (FLEXERIL) 10 MG tablet  Benign essential HTN - Plan: benazepril-hydrochlorthiazide (LOTENSIN HCT) 20-12.5 MG per tablet  HLD (hyperlipidemia) - Plan: pravastatin (PRAVACHOL) 40 MG tablet  Insomnia - Plan: zolpidem (AMBIEN) 10 MG tablet  Vitamin D deficiency - Plan: zolpidem (AMBIEN) 10 MG tablet  Routine general medical examination at a health care facility - Plan: PAP, Thin Prep w/HPV rflx HPV Type 16/18, Ambulatory referral to Gastroenterology  Patient is also complaining of pain in her middle of her back between her shoulder blades that is intense. It has been occurring almost on a daily basis for the last month. Given her history of breast cancer and renal cell carcinoma, I would like to obtain x-rays of thoracic spine to rule out any evidence of bone metastasis. However I tend to believe the pain is likely muscular. Mammogram is up-to-date. Pap smear was sent to pathology. I will schedule the patient for  colonoscopy. Increase benazepril hydrochlorothiazide to 40/25 by mouth daily. Start the patient on pravastatin 40 mg a day for hyperlipidemia. Recheck fasting lipid panel in 3 months

## 2014-12-08 LAB — PAP, THIN PREP W/HPV RFLX HPV TYPE 16/18: HPV DNA High Risk: NOT DETECTED

## 2014-12-10 ENCOUNTER — Encounter: Payer: Self-pay | Admitting: Family Medicine

## 2014-12-11 ENCOUNTER — Ambulatory Visit
Admission: RE | Admit: 2014-12-11 | Discharge: 2014-12-11 | Disposition: A | Discharge status: Home / Self Care | Payer: BLUE CROSS/BLUE SHIELD | Source: Ambulatory Visit | Attending: Family Medicine | Admitting: Family Medicine

## 2014-12-11 DIAGNOSIS — M549 Dorsalgia, unspecified: Secondary | ICD-10-CM

## 2014-12-14 ENCOUNTER — Encounter: Payer: Self-pay | Admitting: Family Medicine

## 2015-01-12 ENCOUNTER — Other Ambulatory Visit: Payer: Self-pay | Admitting: Family Medicine

## 2015-01-12 MED ORDER — GABAPENTIN 600 MG PO TABS: 600.0000 mg | ORAL_TABLET | Freq: Three times a day (TID) | ORAL | Status: DC

## 2015-01-12 NOTE — Telephone Encounter (Signed)
Medication refilled per protocol. 

## 2015-01-19 ENCOUNTER — Ambulatory Visit: Payer: BLUE CROSS/BLUE SHIELD | Admitting: Family Medicine

## 2015-01-19 ENCOUNTER — Other Ambulatory Visit: Payer: BLUE CROSS/BLUE SHIELD

## 2015-01-19 ENCOUNTER — Other Ambulatory Visit: Payer: Self-pay | Admitting: Family Medicine

## 2015-01-19 DIAGNOSIS — M549 Dorsalgia, unspecified: Secondary | ICD-10-CM

## 2015-01-19 MED ORDER — CYCLOBENZAPRINE HCL 10 MG PO TABS: 10.0000 mg | ORAL_TABLET | Freq: Three times a day (TID) | ORAL | Status: DC | PRN

## 2015-01-19 NOTE — Telephone Encounter (Signed)
Medication refilled per protocol.  Pt aware

## 2015-01-19 NOTE — Telephone Encounter (Signed)
LRF 12/07/14 #30.  LOV 12/07/14  OK refill?

## 2015-01-19 NOTE — Telephone Encounter (Signed)
Ok rf x 3

## 2015-01-28 ENCOUNTER — Ambulatory Visit: Payer: BLUE CROSS/BLUE SHIELD | Admitting: Family Medicine

## 2015-02-19 ENCOUNTER — Encounter: Payer: BLUE CROSS/BLUE SHIELD | Admitting: Internal Medicine

## 2015-02-24 ENCOUNTER — Telehealth: Payer: Self-pay | Admitting: Oncology

## 2015-02-24 NOTE — Telephone Encounter (Signed)
returned call adn s.w. pt and r/s appts per pt request....ok adn aware

## 2015-02-25 ENCOUNTER — Other Ambulatory Visit: Payer: BC Managed Care – PPO

## 2015-03-03 ENCOUNTER — Other Ambulatory Visit: Payer: Self-pay

## 2015-03-03 DIAGNOSIS — C50911 Malignant neoplasm of unspecified site of right female breast: Secondary | ICD-10-CM

## 2015-03-04 ENCOUNTER — Other Ambulatory Visit (HOSPITAL_BASED_OUTPATIENT_CLINIC_OR_DEPARTMENT_OTHER): Payer: BLUE CROSS/BLUE SHIELD

## 2015-03-04 ENCOUNTER — Ambulatory Visit: Payer: BC Managed Care – PPO | Admitting: Oncology

## 2015-03-04 DIAGNOSIS — C50211 Malignant neoplasm of upper-inner quadrant of right female breast: Secondary | ICD-10-CM

## 2015-03-04 DIAGNOSIS — C50911 Malignant neoplasm of unspecified site of right female breast: Secondary | ICD-10-CM

## 2015-03-04 LAB — COMPREHENSIVE METABOLIC PANEL (CC13)
ALBUMIN: 3.8 g/dL (ref 3.5–5.0)
ALK PHOS: 118 U/L (ref 40–150)
ALT: 34 U/L (ref 0–55)
ANION GAP: 9 meq/L (ref 3–11)
AST: 26 U/L (ref 5–34)
BILIRUBIN TOTAL: 0.65 mg/dL (ref 0.20–1.20)
BUN: 9 mg/dL (ref 7.0–26.0)
CO2: 26 meq/L (ref 22–29)
Calcium: 10 mg/dL (ref 8.4–10.4)
Chloride: 106 mEq/L (ref 98–109)
Creatinine: 0.9 mg/dL (ref 0.6–1.1)
EGFR: 79 mL/min/{1.73_m2} — AB (ref >90)
Glucose: 134 mg/dl (ref 70–140)
POTASSIUM: 3.2 meq/L — AB (ref 3.5–5.1)
Sodium: 141 mEq/L (ref 136–145)
TOTAL PROTEIN: 7.8 g/dL (ref 6.4–8.3)

## 2015-03-04 LAB — CBC WITH DIFFERENTIAL/PLATELET
BASO%: 0.7 % (ref 0.0–2.0)
BASOS ABS: 0.1 10*3/uL (ref 0.0–0.1)
EOS%: 2.3 % (ref 0.0–7.0)
Eosinophils Absolute: 0.2 10*3/uL (ref 0.0–0.5)
HCT: 40.9 % (ref 34.8–46.6)
HEMOGLOBIN: 14 g/dL (ref 11.6–15.9)
LYMPH%: 27.4 % (ref 14.0–49.7)
MCH: 28.6 pg (ref 25.1–34.0)
MCHC: 34.3 g/dL (ref 31.5–36.0)
MCV: 83.3 fL (ref 79.5–101.0)
MONO#: 0.8 10*3/uL (ref 0.1–0.9)
MONO%: 8.1 % (ref 0.0–14.0)
NEUT#: 6.1 10*3/uL (ref 1.5–6.5)
NEUT%: 61.5 % (ref 38.4–76.8)
PLATELETS: 319 10*3/uL (ref 145–400)
RBC: 4.91 10*6/uL (ref 3.70–5.45)
RDW: 14.6 % — AB (ref 11.2–14.5)
WBC: 10 10*3/uL (ref 3.9–10.3)
lymph#: 2.7 10*3/uL (ref 0.9–3.3)

## 2015-03-11 ENCOUNTER — Ambulatory Visit (HOSPITAL_BASED_OUTPATIENT_CLINIC_OR_DEPARTMENT_OTHER): Payer: BLUE CROSS/BLUE SHIELD | Admitting: Oncology

## 2015-03-11 ENCOUNTER — Telehealth: Payer: Self-pay | Admitting: Oncology

## 2015-03-11 VITALS — BP 122/73 | HR 111 | Temp 98.3°F | Resp 19 | Ht 61.0 in | Wt 189.8 lb

## 2015-03-11 DIAGNOSIS — C50211 Malignant neoplasm of upper-inner quadrant of right female breast: Secondary | ICD-10-CM | POA: Diagnosis not present

## 2015-03-11 DIAGNOSIS — C50911 Malignant neoplasm of unspecified site of right female breast: Secondary | ICD-10-CM

## 2015-03-11 DIAGNOSIS — N899 Noninflammatory disorder of vagina, unspecified: Secondary | ICD-10-CM | POA: Diagnosis not present

## 2015-03-11 NOTE — Telephone Encounter (Signed)
Appointments made and avs printed for pateint °

## 2015-03-11 NOTE — Progress Notes (Signed)
[facility]  Telephone:([phone]) [phone] Fax:(336) [phone]  OFFICE PROGRESS NOTE   ID: [name]   DOB: 1965-08-14  MR#: [id number]  URK#:[id number]   PCP: Hannah Fraction, MD GYN: [name], [name] SU: [name], [name]  HISTORY OF PRESENT ILLNESS: From Dr. Collier Salina [name]'s new patient evaluation note dated 03/07/2006:  "This is a pleasant 49 year old woman from [facility] referred by Dr. Brantley [name] for evaluation and treatment of breast cancer.  This woman has been in reasonably good health.  She noted a breast mass on June of this year and by August this area had become tender.  Bilateral diagnostic mammogram with right breast ultrasound was performed on 12/25/2005 and physical examination showed discrete thickening in right breast most pronounced at 12'o clock.  Ultrasound showed diffuse shadow in the breast most pronounced at 12'o clock.  An ultrasound and core biopsy was recommended.  Biopsy performed on 12/25/2005 showed invasive mammary carcinoma.  Lobular features were identified.  The tumor was strongly ER and PR positive at 94% and 98% respectively.  Tumor was HER2/neu 1+, FISH negative, and with a proliferative index of 11%.  MRI scan done on 01/04/2006 showed an enhancing mass in the upper inner quadrant of the right breast measuring 4.9 x 2.9 x 2.8 cm.  Lesion involves the subareolar area without directly involving the right nipple.  Images of the left breast show small intramammary lymph node at the lateral aspect of the breast.  No other abnormalities were seen.  After some discussion, the patient elected to undergo a mastectomy, which occurred on 02/02/2006.  Final pathology showed a 4.5 cm grade 2/3 ductal cancer, with vascular invasion was seen.  Surgical margins were clear.  A total of 5 sentinel lymph nodes were identified, two of which had metastatic carcinoma.  Postoperative course has been complicated by a port impaction and so her port has been removed.  She  has been on antibiotics, I believe she was on amoxicillin and now on doxycycline.  At the time of her port placement, she was thought to have an irregular heart rate."    Her subsequent history is as detailed below.  INTERVAL HISTORY: [name] returns today for followup of her breast cancer. She continues on letrozole, which she obtains at about $12 a month. (Some pharmacies were charging her up to $400 a month at one time). She does not have problems with hot flashes or arthralgias myalgias, but she does have vaginal dryness issues on this drug.  REVIEW OF SYSTEMS: Her mother fell and had a fracture. She is now in rehabilitation. This is taking up a lot of [name]'s time. 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 She had been scheduled for colonoscopy this year but it had to be postponed because of her mother's problem. Aside from these issues, a detailed review of systems today was stable.  PAST MEDICAL HISTORY: Past Medical History  Diagnosis Date  . Breast cancer 12/2005    Right breast  . Cancer 02/2007    Renal cell carcinoma - Left kidney  . Insomnia   . Fibromyalgia   . Ruptured disc, cervical   . Hypertension   History of fibromyalgia with restless legs syndrome.  PAST SURGICAL HISTORY: Past Surgical History  Procedure Laterality Date  . Mastectomy Right 02/02/2006  . Partial nephrectomy Left 06/11/2006  . Cesarean section      C-section x 3  .  Cervical disc surgery    . Salpingoophorectomy    . Port-a-cath removal    . Knee arthroscopy Right 1998  Includes, neck surgery in March 2003, kidney stone lithotripsy in 2003, 1999 and 1998, and three cesarean sections, and knee surgery in 1998.  FAMILY HISTORY Family History  Problem Relation Age of Onset  . Hypertension Mother   . Diabetes Father   . Stroke Father   . Hypertension Brother   . Cancer Maternal Aunt     Ovarian cancer  . Cancer Maternal Uncle      Colon Cancer  . Cancer Paternal Uncle     Brain cancer  . Cancer Maternal Grandmother     Unknown cancer origin  . Hypertension Brother   . Cancer Maternal Aunt     Colon Cancer  Two brothers and two sisters in good health.  Mother who is with her today is in good health.  There is a history of ovarian cancer in the maternal aunt and maternal grandmother may have had some form of intestinal cancer.   GYNECOLOGIC HISTORY: She is gravida 5, para 3 with two miscarriages. She has a history of menometrorrhagia.   SOCIAL HISTORY:  her husband [name]  is a Theme park manager.  They've been married for 28 years.  They have 3 adult children, 2 daughters and one son. One of her daughters and her son live at home with her and her husband. 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  In her spare time she likes to read, go to church, and cook.   ADVANCED DIRECTIVES: Not on file  HEALTH MAINTENANCE: Social History  Substance Use Topics  . Smoking status: Former Smoker    Types: Cigarettes  . Smokeless tobacco: Never Used     Comment: Quit over 20 years ago  . Alcohol Use: No    Colonoscopy: Due 2016 PAP:  Bone density: Last bone density scan on 02/14/2012 showed a T score of -0.8  (normal). Lipid panel:  Allergies  Allergen Reactions  . Codeine Nausea And Vomiting    Current Outpatient Prescriptions  Medication Sig Dispense Refill  . benazepril-hydrochlorthiazide (LOTENSIN HCT) 20-12.5 MG per tablet Take 2 tablets by mouth daily. 60 tablet 5  . cholecalciferol (VITAMIN D) 1000 UNITS tablet Take 5,000 Units by mouth daily.      . cyclobenzaprine (FLEXERIL) 10 MG tablet Take 1 tablet (10 mg total) by mouth 3 (three) times daily as needed for muscle spasms. 30 tablet 2  . gabapentin (NEURONTIN) 600 MG tablet Take 1 tablet (600 mg total) by mouth 3 (three) times daily. 90 tablet 3  . letrozole (FEMARA) 2.5 MG tablet TAKE 1  TABLET BY MOUTH DAILY 90 tablet 3  . pravastatin (PRAVACHOL) 40 MG tablet Take 1 tablet (40 mg total) by mouth daily. 30 tablet 5  . zolpidem (AMBIEN) 10 MG tablet Take 1 tablet (10 mg total) by mouth at bedtime. 30 tablet 5   No current facility-administered medications for this visit.    OBJECTIVE: Middle-aged white woman who appears well Filed Vitals:   03/11/15 0939  BP: 122/73  Pulse: 111  Temp: 98.3 F (36.8 C)  Resp: 19     Body mass index is 35.88 kg/(m^2).      ECOG FS: 1 - Symptomatic but completely ambulatory  Sclerae unicteric, EOMs intact Oropharynx clear, no thrush or other lesions No cervical or supraclavicular adenopathy Lungs no rales or rhonchi Heart  regular rate and rhythm Abd soft, obese, nontender, positive bowel sounds MSK no focal spinal tenderness, no upper extremity lymphedema Neuro: nonfocal, well oriented, appropriate affect Breasts: Status post right mastectomy with no chest wall recurrence; right axilla is benign. Left breast is unremarkable.   LAB RESULTS: Lab Results  Component Value Date   WBC 10.0 03/04/2015   NEUTROABS 6.1 03/04/2015   HGB 14.0 03/04/2015   HCT 40.9 03/04/2015   MCV 83.3 03/04/2015   PLT 319 03/04/2015      Chemistry      Component Value Date/Time   NA 141 03/04/2015 0935   NA 141 12/02/2014 1019   K 3.2* 03/04/2015 0935   K 4.3 12/02/2014 1019   CL 100 12/02/2014 1019   CL 105 09/30/2012 1207   CO2 26 03/04/2015 0935   CO2 22 12/02/2014 1019   BUN 9.0 03/04/2015 0935   BUN 12 12/02/2014 1019   CREATININE 0.9 03/04/2015 0935   CREATININE 0.84 12/02/2014 1019   CREATININE 0.76 10/31/2011 1345      Component Value Date/Time   CALCIUM 10.0 03/04/2015 0935   CALCIUM 10.6* 12/02/2014 1019   ALKPHOS 118 03/04/2015 0935   ALKPHOS 116 12/02/2014 1019   AST 26 03/04/2015 0935   AST 20 12/02/2014 1019   ALT 34 03/04/2015 0935   ALT 20 12/02/2014 1019   BILITOT 0.65 03/04/2015 0935   BILITOT 0.6 12/02/2014  1019       Lab Results  Component Value Date   LABCA2 17 10/31/2011    Urinalysis No results found for: COLORURINE  STUDIES: CLINICAL DATA: Mid back pain for 2 months, history of breast cancer  EXAM: THORACIC SPINE - 3 VIEWS  COMPARISON: Thoracic MRI 05/29/2012.  FINDINGS: There is no evidence of thoracic spine fracture. Alignment is normal. No other significant bone abnormalities are identified. Cervical fusion hardware partly visualized. Surgical clips are noted on the lateral projection.  IMPRESSION: Negative.   Electronically Signed  By: Conchita Paris [name]  On: 12/11/2014 11:56  ASSESSMENT: 49 y.o. [place], [place] woman:  1.  .  Status post right breast mastectomy with sentinel node biopsy on 02/02/2006, for a [name] IIB, pT2 pN1, 4.5 cm invasive ductal carcinoma with lobular features grade 2, ductal carcinoma in situ, ER 94%, PR 90%, Ki-67 11%, HER-2/neu by FISH no amplification, 2/5 positive metastatic lymph nodes.  2.  Status post right axillary regional resection of lymph nodes on 03/27/2006 which showed 2/13 metastatic lymph nodes.  3.  History of renal cell carcinoma status post partial nephrectomy on 06/11/2006.    4. Status post bilateral salpingo-oophorectomy on 06/11/2006 also.  5.  Status post adjuvant chemotherapy with FEC x 3 cycles with Neulasta support from 07/02/2006 through 08/13/2006.  This was followed by adjuvant chemotherapy with Taxotere with Neulasta support x 3 cycles from 09/03/2006 through 10/15/2006.    6.  Status post radiation therapy from 11/06/2006 through 12/21/2006.  7.  The patient started antiestrogen therapy with Arimidex from 12/2006 until 01/2008.  The patient was then switched to antiestrogen therapy with Femara in 01/2008.  PLAN: Dyasia is now 9 years out from her definitive surgery with no evidence of disease recurrence. This is very favorable.  He now have data for 10 years of aromatase inhibitors  and we know that there is a further 3% reduction in risk and therefore we are continuing to 10 years which means Suanne Marker has 2 more years of follow-up here. She is very agreeable to this plan.  The biggest issue aside from cost, which is currently manageable, on the letrozole, is vaginal dryness. I gave her information on our "intimacy and pelvic health" program to see if that would be of help to her.  Otherwise I wrote for a repeat bone density to be done February 2016. She is also overdue on colonoscopy and that should be done later this year  She knows to call for any problems that may develop before her next visit here.  Chauncey Cruel, MD  03/11/2015, 9:43 AM

## 2015-05-11 ENCOUNTER — Other Ambulatory Visit: Payer: Self-pay | Admitting: *Deleted

## 2015-05-11 MED ORDER — GABAPENTIN 600 MG PO TABS: 600.0000 mg | ORAL_TABLET | Freq: Three times a day (TID) | ORAL | Status: DC

## 2015-05-11 NOTE — Telephone Encounter (Signed)
Received fax requesting refill on gabapentin.   Refill appropriate and filled per protocol. 

## 2015-06-14 ENCOUNTER — Telehealth: Payer: Self-pay | Admitting: Family Medicine

## 2015-06-14 DIAGNOSIS — E785 Hyperlipidemia, unspecified: Secondary | ICD-10-CM

## 2015-06-14 MED ORDER — PRAVASTATIN SODIUM 40 MG PO TABS: 40.0000 mg | ORAL_TABLET | Freq: Every day | ORAL | Status: DC

## 2015-06-14 NOTE — Telephone Encounter (Signed)
Medication called/sent to requested pharmacy - pt does need to come in for ov and fasting labs will send letter

## 2015-06-14 NOTE — Telephone Encounter (Signed)
Pt is calling for a refill of Pravastatin. The pharmacy told her that they faxed the request twice last week. She has been out for a day now.  [place].

## 2015-06-16 ENCOUNTER — Other Ambulatory Visit: Payer: Self-pay | Admitting: Family Medicine

## 2015-06-16 DIAGNOSIS — G4700 Insomnia, unspecified: Secondary | ICD-10-CM

## 2015-06-16 DIAGNOSIS — E559 Vitamin D deficiency, unspecified: Secondary | ICD-10-CM

## 2015-06-16 NOTE — Telephone Encounter (Signed)
LRF 12/07/14 #30 + 5.  LOV 12/07/14  OK refill?

## 2015-06-17 MED ORDER — ZOLPIDEM TARTRATE 10 MG PO TABS: 10.0000 mg | ORAL_TABLET | Freq: Every day | ORAL | Status: DC

## 2015-06-17 NOTE — Telephone Encounter (Signed)
rx called in

## 2015-06-17 NOTE — Telephone Encounter (Signed)
ok 

## 2015-06-21 ENCOUNTER — Other Ambulatory Visit: Payer: Self-pay | Admitting: Family Medicine

## 2015-06-21 DIAGNOSIS — I10 Essential (primary) hypertension: Secondary | ICD-10-CM

## 2015-06-21 MED ORDER — BENAZEPRIL-HYDROCHLOROTHIAZIDE 20-12.5 MG PO TABS: 2.0000 | ORAL_TABLET | Freq: Every day | ORAL | Status: DC

## 2015-06-21 NOTE — Telephone Encounter (Signed)
benazepril-hydrochlorthiazide (LOTENSIN HCT) 20-12.5 MG  [facility] on [address]  She needs this today she had called and requested it from them a couple of days ago.    CB#[phone]

## 2015-06-21 NOTE — Telephone Encounter (Signed)
Script sent to pharmacy for 30 days only, pt husband aware no further refills until seen, pt is due for labs and ov.

## 2015-06-23 ENCOUNTER — Ambulatory Visit
Admission: RE | Admit: 2015-06-23 | Discharge: 2015-06-23 | Disposition: A | Discharge status: Home / Self Care | Payer: BLUE CROSS/BLUE SHIELD | Source: Ambulatory Visit | Attending: Oncology | Admitting: Oncology

## 2015-06-23 ENCOUNTER — Other Ambulatory Visit: Payer: BLUE CROSS/BLUE SHIELD

## 2015-06-23 DIAGNOSIS — R7989 Other specified abnormal findings of blood chemistry: Secondary | ICD-10-CM

## 2015-06-23 DIAGNOSIS — E785 Hyperlipidemia, unspecified: Secondary | ICD-10-CM

## 2015-06-23 DIAGNOSIS — C50911 Malignant neoplasm of unspecified site of right female breast: Secondary | ICD-10-CM

## 2015-06-23 DIAGNOSIS — Z79899 Other long term (current) drug therapy: Secondary | ICD-10-CM

## 2015-06-23 LAB — COMPLETE METABOLIC PANEL WITH GFR
ALK PHOS: 105 U/L (ref 33–130)
ALT: 24 U/L (ref 6–29)
AST: 19 U/L (ref 10–35)
Albumin: 4.1 g/dL (ref 3.6–5.1)
BUN: 11 mg/dL (ref 7–25)
CHLORIDE: 102 mmol/L (ref 98–110)
CO2: 24 mmol/L (ref 20–31)
Calcium: 9.5 mg/dL (ref 8.6–10.4)
Creat: 0.84 mg/dL (ref 0.50–1.05)
GFR, EST NON AFRICAN AMERICAN: 81 mL/min (ref >=60)
GFR, Est African American: >89 mL/min (ref >=60)
GLUCOSE: 89 mg/dL (ref 70–99)
POTASSIUM: 3.4 mmol/L — AB (ref 3.5–5.3)
SODIUM: 140 mmol/L (ref 135–146)
Total Bilirubin: 0.6 mg/dL (ref 0.2–1.2)
Total Protein: 7.5 g/dL (ref 6.1–8.1)

## 2015-06-23 LAB — TSH: TSH: 3.66 mIU/L

## 2015-06-23 LAB — LIPID PANEL
CHOL/HDL RATIO: 3.1 ratio (ref <=5.0)
Cholesterol: 137 mg/dL (ref 125–200)
HDL: 44 mg/dL — AB (ref >=46)
LDL Cholesterol: 68 mg/dL (ref <130)
Triglycerides: 125 mg/dL (ref <150)
VLDL: 25 mg/dL (ref <30)

## 2015-06-29 ENCOUNTER — Ambulatory Visit (INDEPENDENT_AMBULATORY_CARE_PROVIDER_SITE_OTHER): Payer: BLUE CROSS/BLUE SHIELD | Admitting: Family Medicine

## 2015-06-29 ENCOUNTER — Encounter: Payer: Self-pay | Admitting: Family Medicine

## 2015-06-29 VITALS — BP 118/64 | HR 76 | Temp 98.5°F | Resp 16 | Ht 61.0 in | Wt 184.0 lb

## 2015-06-29 DIAGNOSIS — E876 Hypokalemia: Secondary | ICD-10-CM

## 2015-06-29 DIAGNOSIS — I10 Essential (primary) hypertension: Secondary | ICD-10-CM | POA: Diagnosis not present

## 2015-06-29 DIAGNOSIS — E785 Hyperlipidemia, unspecified: Secondary | ICD-10-CM | POA: Diagnosis not present

## 2015-06-29 DIAGNOSIS — F418 Other specified anxiety disorders: Secondary | ICD-10-CM | POA: Diagnosis not present

## 2015-06-29 MED ORDER — ALPRAZOLAM 0.5 MG PO TABS: 0.5000 mg | ORAL_TABLET | Freq: Every evening | ORAL | Status: DC | PRN

## 2015-06-29 MED ORDER — GABAPENTIN 600 MG PO TABS: 600.0000 mg | ORAL_TABLET | Freq: Three times a day (TID) | ORAL | Status: DC

## 2015-06-29 MED ORDER — POTASSIUM CHLORIDE CRYS ER 20 MEQ PO TBCR: 20.0000 meq | EXTENDED_RELEASE_TABLET | Freq: Every day | ORAL | Status: DC

## 2015-06-29 MED ORDER — POTASSIUM CHLORIDE CRYS ER 20 MEQ PO TBCR
20.0000 meq | EXTENDED_RELEASE_TABLET | Freq: Every day | ORAL | Status: DC
Start: 2015-06-29 — End: 2015-06-29

## 2015-06-29 MED ORDER — BENAZEPRIL-HYDROCHLOROTHIAZIDE 20-12.5 MG PO TABS: 2.0000 | ORAL_TABLET | Freq: Every day | ORAL | Status: DC

## 2015-06-29 MED ORDER — PRAVASTATIN SODIUM 40 MG PO TABS: 40.0000 mg | ORAL_TABLET | Freq: Every day | ORAL | Status: DC

## 2015-06-29 NOTE — Progress Notes (Signed)
Subjective:    Patient ID: [name], female    DOB: [birth year], 50 y.o.   MRN: [id number]  HPI  patient is here today for regular medicine check up. She has a history of hypertension but her blood pressure today is well controlled at 118/64. She denies any chest pain shortness of breath or dyspnea on exertion. She also has a history of hyperlipidemia and she is on Pravachol 40 mg by mouth daily. She denies any myalgias or right upper quadrant pain. Her mother has been in and out of a nursing home recently and she is under tremendous stress.  She is having a difficult time sleeping due to the stress of caring for her elderly mother who has some early onset dementia. Lab work as listed below and is significant only for hypokalemia: Lab on 06/23/2015  Component Date Value Ref Range Status  . Sodium 06/23/2015 140  135 - 146 mmol/L Final  . Potassium 06/23/2015 3.4* 3.5 - 5.3 mmol/L Final  . Chloride 06/23/2015 102  98 - 110 mmol/L Final  . CO2 06/23/2015 24  20 - 31 mmol/L Final  . Glucose, Bld 06/23/2015 89  70 - 99 mg/dL Final  . BUN 06/23/2015 11  7 - 25 mg/dL Final  . Creat 06/23/2015 0.84  0.50 - 1.05 mg/dL Final  . Total Bilirubin 06/23/2015 0.6  0.2 - 1.2 mg/dL Final  . Alkaline Phosphatase 06/23/2015 105  33 - 130 U/L Final  . AST 06/23/2015 19  10 - 35 U/L Final  . ALT 06/23/2015 24  6 - 29 U/L Final  . Total Protein 06/23/2015 7.5  6.1 - 8.1 g/dL Final  . Albumin 06/23/2015 4.1  3.6 - 5.1 g/dL Final  . Calcium 06/23/2015 9.5  8.6 - 10.4 mg/dL Final  . GFR, Est African American 06/23/2015 >89  >=60 mL/min Final  . GFR, Est Non African American 06/23/2015 81  >=60 mL/min Final   Comment:   The estimated GFR is a calculation valid for adults (>=79 years old) that uses the CKD-EPI algorithm to adjust for age and sex. It is   not to be used for children, pregnant women, hospitalized patients,    patients on dialysis, or with rapidly changing kidney function. According to the  NKDEP, eGFR >89 is normal, 60-89 shows mild impairment, 30-59 shows moderate impairment, 15-29 shows severe impairment and <15 is ESRD.     . TSH 06/23/2015 3.66   Final   Comment:   Reference Range   > or = 20 Years  0.40-4.50   Pregnancy Range First trimester  0.26-2.66 Second trimester 0.55-2.73 Third trimester  0.43-2.91   ** Please note change in unit of measure and reference range(s). **     . Cholesterol 06/23/2015 137  125 - 200 mg/dL Final  . Triglycerides 06/23/2015 125  <150 mg/dL Final  . HDL 06/23/2015 44* >=46 mg/dL Final  . Total CHOL/HDL Ratio 06/23/2015 3.1  <=5.0 Ratio Final  . VLDL 06/23/2015 25  <30 mg/dL Final  . LDL Cholesterol 06/23/2015 68  <130 mg/dL Final   Comment:   Total Cholesterol/HDL Ratio:CHD Risk                        Coronary Heart Disease Risk Table  Men       Women          1/2 Average Risk              3.4        3.3              Average Risk              5.0        4.4           2X Average Risk              9.6        7.1           3X Average Risk             23.4       11.0 Use the calculated Patient Ratio above and the CHD Risk table  to determine the patient's CHD Risk.    Past Medical History  Diagnosis Date  . Breast cancer (HCC) 12/2005    Right breast  . Cancer (HCC) 02/2007    Renal cell carcinoma - Left kidney  . Insomnia   . Fibromyalgia   . Ruptured disc, cervical   . Hypertension    Past Surgical History  Procedure Laterality Date  . Mastectomy Right 02/02/2006  . Partial nephrectomy Left 06/11/2006  . Cesarean section      C-section x 3  . Cervical disc surgery    . Salpingoophorectomy    . Port-a-cath removal    . Knee arthroscopy Right 1998   Current Outpatient Prescriptions on File Prior to Visit  Medication Sig Dispense Refill  . cyclobenzaprine (FLEXERIL) 10 MG tablet Take 1 tablet (10 mg total) by mouth 3 (three) times daily as needed for muscle spasms. 30 tablet  2  . letrozole (FEMARA) 2.5 MG tablet TAKE 1 TABLET BY MOUTH DAILY 90 tablet 3  . zolpidem (AMBIEN) 10 MG tablet Take 1 tablet (10 mg total) by mouth at bedtime. 30 tablet 5   No current facility-administered medications on file prior to visit.   Allergies  Allergen Reactions  . Codeine Nausea And Vomiting   Social History   Social History  . Marital Status: Married    Spouse Name: N/A  . Number of Children: N/A  . Years of Education: N/A   Occupational History  . Not on file.   Social History Main Topics  . Smoking status: Former Smoker    Types: Cigarettes  . Smokeless tobacco: Never Used     Comment: Quit over 20 years ago  . Alcohol Use: No  . Drug Use: No  . Sexual Activity: Yes    Birth Control/ Protection: Surgical   Other Topics Concern  . Not on file   Social History Narrative      Review of Systems  All other systems reviewed and are negative.      Objective:   Physical Exam  Cardiovascular: Normal rate, regular rhythm and normal heart sounds.   No murmur heard. Pulmonary/Chest: Effort normal and breath sounds normal. No respiratory distress. She has no wheezes. She has no rales.  Abdominal: Soft. Bowel sounds are normal.  Musculoskeletal: She exhibits no edema.  Vitals reviewed.         Assessment & Plan:  Hypokalemia - Plan: potassium chloride SA (K-DUR,KLOR-CON) 20 MEQ tablet, DISCONTINUED: potassium chloride SA (K-DUR,KLOR-CON) 20 MEQ tablet  Situational anxiety - Plan: ALPRAZolam (XANAX) 0.5 MG tablet  Benign essential HTN - Plan: benazepril-hydrochlorthiazide (LOTENSIN HCT) 20-12.5 MG tablet  HLD (hyperlipidemia) - Plan: pravastatin (PRAVACHOL) 40 MG tablet   Blood pressures excellent. Cholesterol is outstanding. I will continue her current medicines at the present dosages. Also start the patient on potassium 2012 by mouth daily due to persistent hypokalemia secondary to her ACE inhibitor. Also gave her a one-time prescription for  Xanax 0.5 mg every 8 hours as needed for anxiety or insomnia due to the stress of caring for her elderly mother with dementia.

## 2015-07-16 MED FILL — LETROZOLE 2.5 MG TABLET: 2.5 | 90 days supply | Qty: 90 | Fill #3

## 2015-08-12 ENCOUNTER — Encounter: Payer: Self-pay | Admitting: Physician Assistant

## 2015-08-12 ENCOUNTER — Ambulatory Visit (INDEPENDENT_AMBULATORY_CARE_PROVIDER_SITE_OTHER): Payer: BLUE CROSS/BLUE SHIELD | Admitting: Physician Assistant

## 2015-08-12 VITALS — BP 104/76 | HR 84 | Temp 99.5°F | Resp 18 | Wt 184.0 lb

## 2015-08-12 DIAGNOSIS — J988 Other specified respiratory disorders: Secondary | ICD-10-CM | POA: Diagnosis not present

## 2015-08-12 MED ORDER — BENZONATATE 200 MG PO CAPS: 200.0000 mg | ORAL_CAPSULE | Freq: Two times a day (BID) | ORAL | Status: DC | PRN

## 2015-08-12 MED ORDER — AZITHROMYCIN 250 MG PO TABS: ORAL_TABLET | ORAL | Status: DC

## 2015-08-12 NOTE — Progress Notes (Signed)
Patient ID: [name] MRN: [id number], DOB: 03/01/1966, 50 y.o. Date of Encounter: 08/12/2015, 5:04 PM    Chief Complaint:  Chief Complaint  Patient presents with  . sick x 4 days    fever, achy, cough     HPI: 50 y.o. year old white female presents with above.   She states that on Monday 08/09/15 is when symptoms started and at that time she had cough. States that on Tuesday she developed fever and body aches. States that yesterday fever was 102.2. States that she took Advil this morning. Noted temperature is currently reading 99.5 here.--OV 4 pm. She adds that she had some loose stools yesterday and today. Says that she has had some pain with her cough so she wanted to make sure that her lungs were clear. Says that she feels phlegm coming up in her chest but can't get it out.     Home Meds:   Outpatient Prescriptions Prior to Visit  Medication Sig Dispense Refill  . ALPRAZolam (XANAX) 0.5 MG tablet Take 1 tablet (0.5 mg total) by mouth at bedtime as needed for anxiety. 30 tablet 0  . benazepril-hydrochlorthiazide (LOTENSIN HCT) 20-12.5 MG tablet Take 2 tablets by mouth daily. 60 tablet 11  . cyclobenzaprine (FLEXERIL) 10 MG tablet Take 1 tablet (10 mg total) by mouth 3 (three) times daily as needed for muscle spasms. 30 tablet 2  . gabapentin (NEURONTIN) 600 MG tablet Take 1 tablet (600 mg total) by mouth 3 (three) times daily. 90 tablet 5  . letrozole (FEMARA) 2.5 MG tablet TAKE 1 TABLET BY MOUTH DAILY 90 tablet 3  . potassium chloride SA (K-DUR,KLOR-CON) 20 MEQ tablet Take 1 tablet (20 mEq total) by mouth daily. 30 tablet 5  . pravastatin (PRAVACHOL) 40 MG tablet Take 1 tablet (40 mg total) by mouth daily. 30 tablet 5  . zolpidem (AMBIEN) 10 MG tablet Take 1 tablet (10 mg total) by mouth at bedtime. 30 tablet 5   No facility-administered medications prior to visit.    Allergies:  Allergies  Allergen Reactions  . Codeine Nausea And Vomiting      Review of  Systems: See HPI for pertinent ROS. All other ROS negative.    Physical Exam: Blood pressure 104/76, pulse 84, temperature 99.5 F (37.5 C), temperature source Oral, resp. rate 18, weight 184 lb (83.462 kg)., Body mass index is 34.78 kg/(m^2). General:  WNWD WF. Appears in no acute distress. HEENT: Normocephalic, atraumatic, eyes without discharge, sclera non-icteric, nares are without discharge. Bilateral auditory canals clear, TM's are without perforation, pearly grey and translucent with reflective cone of light bilaterally. Oral cavity moist, posterior pharynx without exudate, erythema, peritonsillar abscess. No tenderness with percussion to frontal or maxillary sinuses bilaterally.  Neck: Supple. No thyromegaly. No lymphadenopathy. Lungs: Clear bilaterally to auscultation without wheezes, rales, or rhonchi. Breathing is unlabored. Heart: Regular rhythm. No murmurs, rubs, or gallops. Msk:  Strength and tone normal for age. Extremities/Skin: Warm and dry. Neuro: Alert and oriented X 3. Moves all extremities spontaneously. Gait is normal. CNII-XII grossly in tact. Psych:  Responds to questions appropriately with a normal affect.     ASSESSMENT AND PLAN:  50 y.o. year old female with  1. Respiratory infection Symptoms consistent with this possibly being influenza. However given duration of symptoms she would not get much benefit from Tamiflu at this point. Discussed with patient that symptoms consistent with influenza or some other type of viral illness. Given that tomorrow is Friday and  that the weekend is coming up I will go ahead and send in prescription for antibiotic to have available if needed. I told her to monitor her temperature off of medication and only if she has fever 101 or greater then go ahead and start antibiotic. Otherwise the fever is drifting down from where it was 102 yesterday, then this consistent with virus. Also discussed the fact that she has had loose  stools---one this makes this more consistent with viral illness; also discussed the fact that antibiotics usually cause GI upset and that if she started antibody is now this would worsen this. Also the fact that if she does have looser stools and she would not be absorbing antibiotics anyway.  Recommend her add Mucinex DM as expectorant. Use Tessalon as cough suppressant especially so that she can get sleep at night. Monitor temperature. Only add antibiotic if temperature still reading 101 or greater off of medications. Or if cough persist greater than 7 days. - benzonatate (TESSALON) 200 MG capsule; Take 1 capsule (200 mg total) by mouth 2 (two) times daily as needed for cough.  Dispense: 20 capsule; Refill: 0 - azithromycin (ZITHROMAX) 250 MG tablet; Day 1: Take 2 daily. Days 2-5: Take 1 daily.  Dispense: 6 tablet; Refill: 0   Signed, [address] [place], [place], [facility] 08/12/2015 5:04 PM

## 2015-09-07 ENCOUNTER — Ambulatory Visit (INDEPENDENT_AMBULATORY_CARE_PROVIDER_SITE_OTHER): Payer: BLUE CROSS/BLUE SHIELD | Admitting: Family Medicine

## 2015-09-07 ENCOUNTER — Ambulatory Visit
Admission: RE | Admit: 2015-09-07 | Discharge: 2015-09-07 | Disposition: A | Discharge status: Home / Self Care | Payer: BLUE CROSS/BLUE SHIELD | Source: Ambulatory Visit | Attending: Family Medicine | Admitting: Family Medicine

## 2015-09-07 ENCOUNTER — Encounter: Payer: Self-pay | Admitting: Family Medicine

## 2015-09-07 VITALS — BP 110/86 | HR 96 | Temp 98.4°F | Resp 16 | Ht 61.0 in | Wt 187.0 lb

## 2015-09-07 DIAGNOSIS — R05 Cough: Secondary | ICD-10-CM

## 2015-09-07 DIAGNOSIS — I10 Essential (primary) hypertension: Secondary | ICD-10-CM

## 2015-09-07 DIAGNOSIS — M549 Dorsalgia, unspecified: Secondary | ICD-10-CM

## 2015-09-07 DIAGNOSIS — R053 Chronic cough: Secondary | ICD-10-CM

## 2015-09-07 MED ORDER — LOSARTAN POTASSIUM-HCTZ 100-12.5 MG PO TABS: 1.0000 | ORAL_TABLET | Freq: Every day | ORAL | Status: DC

## 2015-09-07 MED ORDER — CYCLOBENZAPRINE HCL 10 MG PO TABS: 10.0000 mg | ORAL_TABLET | Freq: Three times a day (TID) | ORAL | Status: DC | PRN

## 2015-09-07 MED ORDER — BENZONATATE 100 MG PO CAPS: 200.0000 mg | ORAL_CAPSULE | Freq: Three times a day (TID) | ORAL | Status: DC | PRN

## 2015-09-07 NOTE — Progress Notes (Signed)
Subjective:    Patient ID: [name], female    DOB: December 19, 1965, 50 y.o.   MRN: [id number]  HPI  Please see her last office visit March 30. Her fever has subsided however she continues to have a cough almost 5 weeks later. The cough is more of an itchy irritation in the back of her throat that will not stop. Sometimes she coughs so hard she feels that she may pass out. She denies any chest pain. She denies any fevers or chills. She denies any hemoptysis. She denies any night sweats or shortness of breath or pleurisy Past Medical History  Diagnosis Date  . Breast cancer ([place]) 12/2005    Right breast  . Cancer ([place]) 02/2007    Renal cell carcinoma - Left kidney  . Insomnia   . Fibromyalgia   . Ruptured disc, cervical   . Hypertension    Past Surgical History  Procedure Laterality Date  . Mastectomy Right 02/02/2006  . Partial nephrectomy Left 06/11/2006  . Cesarean section      C-section x 3  . Cervical disc surgery    . Salpingoophorectomy    . Port-a-cath removal    . Knee arthroscopy Right 1998   Current Outpatient Prescriptions on File Prior to Visit  Medication Sig Dispense Refill  . ALPRAZolam (XANAX) 0.5 MG tablet Take 1 tablet (0.5 mg total) by mouth at bedtime as needed for anxiety. 30 tablet 0  . benazepril-hydrochlorthiazide (LOTENSIN HCT) 20-12.5 MG tablet Take 2 tablets by mouth daily. 60 tablet 11  . gabapentin (NEURONTIN) 600 MG tablet Take 1 tablet (600 mg total) by mouth 3 (three) times daily. 90 tablet 5  . letrozole (FEMARA) 2.5 MG tablet TAKE 1 TABLET BY MOUTH DAILY 90 tablet 3  . potassium chloride SA (K-DUR,KLOR-CON) 20 MEQ tablet Take 1 tablet (20 mEq total) by mouth daily. 30 tablet 5  . pravastatin (PRAVACHOL) 40 MG tablet Take 1 tablet (40 mg total) by mouth daily. 30 tablet 5  . zolpidem (AMBIEN) 10 MG tablet Take 1 tablet (10 mg total) by mouth at bedtime. 30 tablet 5   No current facility-administered medications on file prior to visit.    Allergies  Allergen Reactions  . Codeine Nausea And Vomiting   Social History   Social History  . Marital Status: Married    Spouse Name: N/A  . Number of Children: N/A  . Years of Education: N/A   Occupational History  . Not on file.   Social History Main Topics  . Smoking status: Former Smoker    Types: Cigarettes  . Smokeless tobacco: Never Used     Comment: Quit over 20 years ago  . Alcohol Use: No  . Drug Use: No  . Sexual Activity: Yes    Birth Control/ Protection: Surgical   Other Topics Concern  . Not on file   Social History Narrative     Review of Systems  All other systems reviewed and are negative.      Objective:   Physical Exam  Constitutional: She appears well-developed and well-nourished.  HENT:  Right Ear: External ear normal.  Left Ear: External ear normal.  Nose: Nose normal.  Mouth/Throat: Oropharynx is clear and moist. No oropharyngeal exudate.  Neck: Neck supple.  Cardiovascular: Normal rate, regular rhythm and normal heart sounds.   Pulmonary/Chest: Effort normal and breath sounds normal. No respiratory distress. She has no wheezes. She has no rales.  Abdominal: Soft. Bowel sounds are normal.  Lymphadenopathy:  She has no cervical adenopathy.  Vitals reviewed.         Assessment & Plan:  Benign essential HTN - Plan: losartan-hydrochlorothiazide (HYZAAR) 100-12.5 MG tablet  Mid-back pain, acute - Plan: cyclobenzaprine (FLEXERIL) 10 MG tablet  Chronic cough - Plan: DG Chest 2 View  I believe the patient had bronchitis earlier the month secondary to influenza. However I believe she has had persistent coughing likely due to increased bradykinin due to her ACE inhibitor. I recommended discontinuation of benazepril and replacement with losartan hydrochlorothiazide 100/12.5 one by mouth daily. She can use Tessalon as needed for coughing. If correct, I expect her coughing should improve over the next 7-10 days. Also obtain a chest  x-ray given her history of malignancy

## 2015-09-09 ENCOUNTER — Encounter: Payer: Self-pay | Admitting: Family Medicine

## 2015-10-18 ENCOUNTER — Other Ambulatory Visit: Payer: Self-pay | Admitting: Oncology

## 2015-10-18 MED FILL — LETROZOLE 2.5 MG TABLET: 2.5 | 90 days supply | Qty: 90 | Fill #0

## 2015-12-12 ENCOUNTER — Other Ambulatory Visit: Payer: Self-pay | Admitting: Family Medicine

## 2015-12-12 DIAGNOSIS — E559 Vitamin D deficiency, unspecified: Secondary | ICD-10-CM

## 2015-12-12 DIAGNOSIS — G4700 Insomnia, unspecified: Secondary | ICD-10-CM

## 2015-12-13 NOTE — Telephone Encounter (Signed)
ok 

## 2015-12-13 NOTE — Telephone Encounter (Signed)
Ok to refill??  Last office visit 09/07/2015.  Last refill 06/17/2015, #5 refills.

## 2015-12-13 NOTE — Telephone Encounter (Signed)
Medication called to pharmacy. 

## 2015-12-15 ENCOUNTER — Telehealth: Payer: Self-pay | Admitting: Family Medicine

## 2015-12-15 DIAGNOSIS — E559 Vitamin D deficiency, unspecified: Secondary | ICD-10-CM

## 2015-12-15 DIAGNOSIS — G4700 Insomnia, unspecified: Secondary | ICD-10-CM

## 2015-12-15 NOTE — Telephone Encounter (Signed)
Requesting a refill on Ambien - Ok to refill??

## 2015-12-16 MED ORDER — ZOLPIDEM TARTRATE 10 MG PO TABS: ORAL_TABLET | ORAL | 5 refills | Status: DC

## 2015-12-16 NOTE — Telephone Encounter (Signed)
ok 

## 2015-12-16 NOTE — Telephone Encounter (Signed)
Medication called/sent to requested pharmacy  

## 2016-01-04 ENCOUNTER — Other Ambulatory Visit: Payer: Self-pay | Admitting: Family Medicine

## 2016-01-04 DIAGNOSIS — E785 Hyperlipidemia, unspecified: Secondary | ICD-10-CM

## 2016-01-13 MED FILL — LETROZOLE 2.5 MG TABLET: 2.5 | 90 days supply | Qty: 90 | Fill #1

## 2016-02-25 ENCOUNTER — Telehealth: Payer: Self-pay | Admitting: Oncology

## 2016-02-25 NOTE — Telephone Encounter (Signed)
Moved 10/27 f/u with HB to 10/31 w/GM. Spoke with patient she is aware.

## 2016-03-02 ENCOUNTER — Other Ambulatory Visit: Payer: Self-pay | Admitting: *Deleted

## 2016-03-02 DIAGNOSIS — C50911 Malignant neoplasm of unspecified site of right female breast: Secondary | ICD-10-CM

## 2016-03-03 ENCOUNTER — Other Ambulatory Visit (HOSPITAL_BASED_OUTPATIENT_CLINIC_OR_DEPARTMENT_OTHER): Payer: BLUE CROSS/BLUE SHIELD

## 2016-03-03 DIAGNOSIS — C50211 Malignant neoplasm of upper-inner quadrant of right female breast: Secondary | ICD-10-CM | POA: Diagnosis not present

## 2016-03-03 DIAGNOSIS — C50911 Malignant neoplasm of unspecified site of right female breast: Secondary | ICD-10-CM

## 2016-03-03 LAB — CBC WITH DIFFERENTIAL/PLATELET
BASO%: 1.3 % (ref 0.0–2.0)
Basophils Absolute: 0.1 10*3/uL (ref 0.0–0.1)
EOS ABS: 0.2 10*3/uL (ref 0.0–0.5)
EOS%: 2.6 % (ref 0.0–7.0)
HCT: 42.2 % (ref 34.8–46.6)
HEMOGLOBIN: 14.3 g/dL (ref 11.6–15.9)
LYMPH%: 24.2 % (ref 14.0–49.7)
MCH: 29.4 pg (ref 25.1–34.0)
MCHC: 33.9 g/dL (ref 31.5–36.0)
MCV: 86.8 fL (ref 79.5–101.0)
MONO#: 0.7 10*3/uL (ref 0.1–0.9)
MONO%: 8 % (ref 0.0–14.0)
NEUT%: 63.9 % (ref 38.4–76.8)
NEUTROS ABS: 5.4 10*3/uL (ref 1.5–6.5)
Platelets: 307 10*3/uL (ref 145–400)
RBC: 4.87 10*6/uL (ref 3.70–5.45)
RDW: 13.7 % (ref 11.2–14.5)
WBC: 8.4 10*3/uL (ref 3.9–10.3)
lymph#: 2 10*3/uL (ref 0.9–3.3)

## 2016-03-03 LAB — COMPREHENSIVE METABOLIC PANEL
ALT: 43 U/L (ref 0–55)
ANION GAP: 13 meq/L — AB (ref 3–11)
AST: 41 U/L — ABNORMAL HIGH (ref 5–34)
Albumin: 3.8 g/dL (ref 3.5–5.0)
Alkaline Phosphatase: 139 U/L (ref 40–150)
BUN: 11.1 mg/dL (ref 7.0–26.0)
CHLORIDE: 106 meq/L (ref 98–109)
CO2: 21 meq/L — AB (ref 22–29)
Calcium: 9.9 mg/dL (ref 8.4–10.4)
Creatinine: 0.9 mg/dL (ref 0.6–1.1)
EGFR: 77 mL/min/{1.73_m2} — AB (ref >90)
Glucose: 123 mg/dl (ref 70–140)
Potassium: 3.6 mEq/L (ref 3.5–5.1)
Sodium: 140 mEq/L (ref 136–145)
Total Bilirubin: 0.73 mg/dL (ref 0.20–1.20)
Total Protein: 7.7 g/dL (ref 6.4–8.3)

## 2016-03-06 ENCOUNTER — Other Ambulatory Visit: Payer: Self-pay | Admitting: Family Medicine

## 2016-03-10 ENCOUNTER — Ambulatory Visit: Payer: BLUE CROSS/BLUE SHIELD | Admitting: Nurse Practitioner

## 2016-03-14 ENCOUNTER — Ambulatory Visit: Payer: BLUE CROSS/BLUE SHIELD | Admitting: Oncology

## 2016-03-14 ENCOUNTER — Telehealth: Payer: Self-pay | Admitting: *Deleted

## 2016-03-14 NOTE — Telephone Encounter (Signed)
Pt called requesting lab results from 10/20. States she does not see Dr Jana Hakim until December.

## 2016-03-20 ENCOUNTER — Other Ambulatory Visit: Payer: Self-pay | Admitting: Oncology

## 2016-03-20 DIAGNOSIS — C50911 Malignant neoplasm of unspecified site of right female breast: Secondary | ICD-10-CM

## 2016-04-12 MED FILL — LETROZOLE 2.5 MG TABLET: 2.5 | 90 days supply | Qty: 90 | Fill #2

## 2016-04-18 ENCOUNTER — Ambulatory Visit (HOSPITAL_BASED_OUTPATIENT_CLINIC_OR_DEPARTMENT_OTHER): Payer: BLUE CROSS/BLUE SHIELD | Admitting: Oncology

## 2016-04-18 ENCOUNTER — Other Ambulatory Visit (HOSPITAL_BASED_OUTPATIENT_CLINIC_OR_DEPARTMENT_OTHER): Payer: BLUE CROSS/BLUE SHIELD

## 2016-04-18 VITALS — BP 139/88 | HR 103 | Temp 97.9°F | Resp 17 | Ht 61.0 in | Wt 196.8 lb

## 2016-04-18 DIAGNOSIS — M791 Myalgia: Secondary | ICD-10-CM

## 2016-04-18 DIAGNOSIS — Z170 Estrogen receptor positive status [ER+]: Secondary | ICD-10-CM

## 2016-04-18 DIAGNOSIS — C50911 Malignant neoplasm of unspecified site of right female breast: Secondary | ICD-10-CM

## 2016-04-18 DIAGNOSIS — G4700 Insomnia, unspecified: Secondary | ICD-10-CM | POA: Diagnosis not present

## 2016-04-18 DIAGNOSIS — C773 Secondary and unspecified malignant neoplasm of axilla and upper limb lymph nodes: Secondary | ICD-10-CM

## 2016-04-18 MED ORDER — LETROZOLE 2.5 MG PO TABS: 2.5000 mg | ORAL_TABLET | Freq: Every day | ORAL | 3 refills | Status: DC

## 2016-04-18 NOTE — Progress Notes (Signed)
[facility]  [phone] [phone]) [phone]  OFFICE PROGRESS NOTE   ID: [name]   DOB: 1965/10/04  MR#: [id number]  JYN#:[id number]   PCP: Odette Fraction, [name], [name], [name]  HISTORY OF PRESENT ILLNESS: From Dr. Collier Salina Rubin's new patient evaluation note dated 03/07/2006:  "This is a pleasant 50 year old woman from [place] referred by Dr. Brantley Stage for evaluation and treatment of breast cancer.  This woman has been in reasonably good health.  She noted a breast mass on June of this year and by August this area had become tender.  Bilateral diagnostic mammogram with right breast ultrasound was performed on 12/25/2005 and physical examination showed discrete thickening in right breast most pronounced at 12'o clock.  Ultrasound showed diffuse shadow in the breast most pronounced at 12'o clock.  An ultrasound and core biopsy was recommended.  Biopsy performed on 12/25/2005 showed invasive mammary carcinoma.  Lobular features were identified.  The tumor was strongly ER and PR positive at 94% and 98% respectively.  Tumor was HER2/neu 1+, FISH negative, and with a proliferative index of 11%.  MRI scan done on 01/04/2006 showed an enhancing mass in the upper inner quadrant of the right breast measuring 4.9 x 2.9 x 2.8 cm.  Lesion involves the subareolar area without directly involving the right nipple.  Images of the left breast show small intramammary lymph node at the lateral aspect of the breast.  No other abnormalities were seen.  After some discussion, the patient elected to undergo a mastectomy, which occurred on 02/02/2006.  Final pathology showed a 4.5 cm grade 2/3 ductal cancer, with vascular invasion was seen.  Surgical margins were clear.  A total of 5 sentinel lymph nodes were identified, two of which had metastatic carcinoma.  Postoperative course has been complicated by a port impaction and so her port has been removed.  She  has been on antibiotics, I believe she was on amoxicillin and now on doxycycline.  At the time of her port placement, she was thought to have an irregular heart rate."    Her subsequent history is as detailed below.  INTERVAL HISTORY: [name] returns today for followup of her estrogen receptor positive breast cancer. She continues on letrozole, with good tolerance. Hot flashes and vaginal dryness are not a major issue. She never developed the arthralgias or myalgias that many patients can experience on this medication. She obtains it at a good price.  She had a bone density in February which was stable and with a T score of -2.0. She was supposed to have had her mammogram at the same time but for some reason it did not get done and she has not had the time to get that accomplished.  ROS: Quite aside from her demanding job she is taking care of her 18 year old mother who is currently in an SNF after a full fracture and a diagnosis of Parkinson's. She also helps take care of her 18-year-old granddaughter and 61-monthold grandson. [name] complains of insomnia. Ambien is not helping. She feels fatigued. Occasionally she has palpitations. She has problems with fibromyalgia which are not new. She doesn't have any time to exercise. A detailed review of systems today was otherwise stable  PAST MEDICAL HISTORY: Past Medical History:  Diagnosis Date  . [place]   [place] breast  . [place]   Renal cell carcinoma - Left kidney  . Fibromyalgia   . Hypertension   .  Insomnia   . Ruptured disc, cervical   History of fibromyalgia with restless legs syndrome.  PAST SURGICAL HISTORY: Past Surgical History:  Procedure Laterality Date  . CERVICAL DISC SURGERY    . CESAREAN SECTION     C-section x 3  . KNEE ARTHROSCOPY Right 1998  . MASTECTOMY Right 02/02/2006  . PARTIAL NEPHRECTOMY Left 06/11/2006  . PORT-A-CATH REMOVAL    . SALPINGOOPHORECTOMY    Includes, neck surgery in  March 2003, kidney stone lithotripsy in 2003, 1999 and 1998, and three cesarean sections, and knee surgery in 1998.  FAMILY HISTORY Family History  Problem Relation Age of Onset  . Hypertension Mother   . Diabetes Father   . Stroke Father   . Hypertension Brother   . Cancer Maternal Aunt     Ovarian cancer  . Cancer Maternal Uncle     Colon Cancer  . Cancer Paternal Uncle     Brain cancer  . Cancer Maternal Grandmother     Unknown cancer origin  . Hypertension Brother   . Cancer Maternal Aunt     Colon Cancer  Two brothers and two sisters in good health.  Mother who is with her today is in good health.  There is a history of ovarian cancer in the maternal aunt and maternal grandmother may have had some form of intestinal cancer.   GYNECOLOGIC HISTORY: She is gravida 5, para 3 with two miscarriages. She has a history of menometrorrhagia.   SOCIAL HISTORY:  her husband Elenore Rota  is a Theme park manager.  They've been married for 28 years.  They have 3 adult children, 2 daughters and one son. One of her daughters and her son live at home with her and her husband. 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  In her spare time she likes to read, go to church, and cook.   ADVANCED DIRECTIVES: Not on file  HEALTH MAINTENANCE: Social History  Substance Use Topics  . Smoking status: Former Smoker    Types: Cigarettes  . Smokeless tobacco: Never Used     Comment: Quit over 20 years ago  . Alcohol use No    Colonoscopy: Due 2016 PAP:  Bone density: Last bone density scan on 02/14/2012 showed a T score of -0.8  (normal). Lipid panel:  Allergies  Allergen Reactions  . Codeine Nausea And Vomiting    Current Outpatient Prescriptions  Medication Sig Dispense Refill  . ALPRAZolam (XANAX) 0.5 MG tablet Take 1 tablet (0.5 mg total) by mouth at bedtime as needed for anxiety. 30 tablet 0  . cyclobenzaprine (FLEXERIL) 10 MG  tablet Take 1 tablet (10 mg total) by mouth 3 (three) times daily as needed for muscle spasms. 30 tablet 2  . gabapentin (NEURONTIN) 600 MG tablet take 1 tablet by mouth three times a day 90 tablet 5  . letrozole (FEMARA) 2.5 MG tablet Take 1 tablet (2.5 mg total) by mouth daily. 90 tablet 3  . losartan-hydrochlorothiazide (HYZAAR) 100-12.5 MG tablet Take 1 tablet by mouth daily. 90 tablet 3  . potassium chloride SA (K-DUR,KLOR-CON) 20 MEQ tablet Take 1 tablet (20 mEq total) by mouth daily. 30 tablet 5  . pravastatin (PRAVACHOL) 40 MG tablet take 1 tablet by mouth once daily 30 tablet 5  . zolpidem (AMBIEN) 10 MG tablet take 1 tablet by mouth at bedtime if needed 30 tablet 5   No current facility-administered medications for this visit.  OBJECTIVE: Middle-aged white woman In no acute distress  Vitals:   04/18/16 0955  BP: 139/88  Pulse: (!) 103  Resp: 17  Temp: 97.9 F (36.6 C)     Body mass index is 37.19 kg/m.      ECOG FS: 1 - Symptomatic but completely ambulatory  Sclerae unicteric, pupils round and equal Oropharynx clear and moist-- no thrush or other lesions No cervical or supraclavicular adenopathy Lungs no rales or rhonchi Heart regular rate and rhythm Abd soft, nontender, positive bowel sounds MSK no focal spinal tenderness, no upper extremity lymphedema Neuro: nonfocal, well oriented, appropriate affect Breasts: The right breast is status post mastectomy. There is no evidence of chest wall recurrence. The right axilla is benign. The left breast is unremarkable  LAB RESULTS: Lab Results  Component Value Date   WBC 8.4 03/03/2016   NEUTROABS 5.4 03/03/2016   HGB 14.3 03/03/2016   HCT 42.2 03/03/2016   MCV 86.8 03/03/2016   PLT 307 03/03/2016      Chemistry      Component Value Date/Time   NA 140 03/03/2016 0936   K 3.6 03/03/2016 0936   CL 102 06/23/2015 1106   CL 105 09/30/2012 1207   CO2 21 (L) 03/03/2016 0936   BUN 11.1 03/03/2016 0936   CREATININE  0.9 03/03/2016 0936      Component Value Date/Time   CALCIUM 9.9 03/03/2016 0936   ALKPHOS 139 03/03/2016 0936   AST 41 (H) 03/03/2016 0936   ALT 43 03/03/2016 0936   BILITOT 0.73 03/03/2016 0936       Lab Results  Component Value Date   LABCA2 17 10/31/2011    Urinalysis No results found for: COLORURINE  STUDIES: No results found.  ASSESSMENT: 50 y.o. [place], [place] woman:  1.  .  Status post right breast mastectomy with sentinel node biopsy on 02/02/2006, for a stage IIB, pT2 pN1, 4.5 cm invasive ductal carcinoma with lobular features grade 2, ductal carcinoma in situ, ER 94%, PR 90%, Ki-67 11%, HER-2/neu by FISH no amplification, 2/5 positive metastatic lymph nodes.  2.  Status post right axillary regional resection of lymph nodes on 03/27/2006 which showed 2/13 metastatic lymph nodes.  3.  History of renal cell carcinoma status post partial nephrectomy on 06/11/2006.    4. Status post bilateral salpingo-oophorectomy on 06/11/2006 also.  5.  Status post adjuvant chemotherapy with FEC x 3 cycles with Neulasta support from 07/02/2006 through 08/13/2006.  This was followed by adjuvant chemotherapy with Taxotere with Neulasta support x 3 cycles from 09/03/2006 through 10/15/2006.    6.  Status post radiation therapy from 11/06/2006 through 12/21/2006.  7.  The patient started antiestrogen therapy with Arimidex from 12/2006 until 01/2008.  The patient was then switched to antiestrogen therapy with Femara in 01/2008.  (a) density February 2017 shows a T score of -2.0 (stable).  PLAN: Tanea is now just over 10 years out from definitive surgery for her breast cancer with no evidence of disease recurrence. This is very favorable.  She is currently over stretched and overstressed and I suggested she dedicate 30-40 minutes of each day just to herself, with no interruptions by phone and no doing something for other people. Ideally she would be walking during this time.  If she joined a gym then she could spend the time they are as well.  Her left mammogram is overdue. I went ahead and wrote the orders so she can have done this month.  She will  complete 10 years of anti-estrogen therapy next August. I will see her one last time next July and she will "graduate" at that visit. She knows to call for any problems that may develop before then.       on the left MAGRINAT,GUSTAV C, MD  04/18/2016, 10:26 AM

## 2016-04-19 ENCOUNTER — Other Ambulatory Visit: Payer: Self-pay | Admitting: Oncology

## 2016-04-19 LAB — HEPATIC FUNCTION PANEL
ALT: 35 IU/L — AB (ref 0–32)
AST: 30 IU/L (ref 0–40)
Albumin, Serum: 4.6 g/dL (ref 3.5–5.5)
Alkaline Phosphatase, S: 124 IU/L — ABNORMAL HIGH (ref 39–117)
BILIRUBIN TOTAL: 0.6 mg/dL (ref 0.0–1.2)
BILIRUBIN, DIRECT: 0.19 mg/dL (ref 0.00–0.40)
Total Protein: 7.5 g/dL (ref 6.0–8.5)

## 2016-04-28 ENCOUNTER — Ambulatory Visit
Admission: RE | Admit: 2016-04-28 | Discharge: 2016-04-28 | Disposition: A | Discharge status: Home / Self Care | Payer: BLUE CROSS/BLUE SHIELD | Source: Ambulatory Visit | Attending: Oncology | Admitting: Oncology

## 2016-04-28 ENCOUNTER — Other Ambulatory Visit: Payer: Self-pay | Admitting: Oncology

## 2016-04-28 DIAGNOSIS — Z1231 Encounter for screening mammogram for malignant neoplasm of breast: Secondary | ICD-10-CM

## 2016-04-28 DIAGNOSIS — Z170 Estrogen receptor positive status [ER+]: Principal | ICD-10-CM

## 2016-04-28 DIAGNOSIS — C50911 Malignant neoplasm of unspecified site of right female breast: Secondary | ICD-10-CM

## 2016-07-05 ENCOUNTER — Other Ambulatory Visit: Payer: Self-pay | Admitting: Family Medicine

## 2016-07-05 DIAGNOSIS — E785 Hyperlipidemia, unspecified: Secondary | ICD-10-CM

## 2016-07-11 MED FILL — LETROZOLE 2.5 MG TABLET: 2.5 | 90 days supply | Qty: 90 | Fill #3

## 2016-07-13 ENCOUNTER — Other Ambulatory Visit: Payer: Self-pay | Admitting: Family Medicine

## 2016-07-13 DIAGNOSIS — G4700 Insomnia, unspecified: Secondary | ICD-10-CM

## 2016-07-13 DIAGNOSIS — E559 Vitamin D deficiency, unspecified: Secondary | ICD-10-CM

## 2016-07-14 ENCOUNTER — Other Ambulatory Visit: Payer: Self-pay

## 2016-07-14 NOTE — Telephone Encounter (Signed)
Patient called to request a refill on her Ambien.  Refill has already been requested in the refill bin waiting on approval from Dr.  Dennard Schaumann

## 2016-07-14 NOTE — Telephone Encounter (Signed)
Ok to refill 

## 2016-07-14 NOTE — Telephone Encounter (Signed)
ok 

## 2016-07-14 NOTE — Telephone Encounter (Signed)
Rx phoned in.   

## 2016-07-14 NOTE — Telephone Encounter (Signed)
rx has been phoned in Pt is aware

## 2016-08-25 ENCOUNTER — Other Ambulatory Visit: Payer: Self-pay | Admitting: Family Medicine

## 2016-08-25 DIAGNOSIS — I10 Essential (primary) hypertension: Secondary | ICD-10-CM

## 2016-08-31 ENCOUNTER — Other Ambulatory Visit: Payer: Self-pay | Admitting: Family Medicine

## 2016-08-31 DIAGNOSIS — M549 Dorsalgia, unspecified: Secondary | ICD-10-CM

## 2016-08-31 NOTE — Telephone Encounter (Signed)
ok 

## 2016-08-31 NOTE — Telephone Encounter (Signed)
Ok to refill 

## 2016-09-01 NOTE — Telephone Encounter (Signed)
ok 

## 2016-09-04 ENCOUNTER — Other Ambulatory Visit: Payer: Self-pay | Admitting: Family Medicine

## 2016-09-23 DIAGNOSIS — H524 Presbyopia: Secondary | ICD-10-CM | POA: Diagnosis not present

## 2016-10-12 ENCOUNTER — Other Ambulatory Visit: Payer: Self-pay | Admitting: Oncology

## 2016-10-12 MED FILL — LETROZOLE 2.5 MG TABLET: 2.5 | 90 days supply | Qty: 90 | Fill #0

## 2016-11-14 ENCOUNTER — Other Ambulatory Visit (HOSPITAL_BASED_OUTPATIENT_CLINIC_OR_DEPARTMENT_OTHER): Payer: BLUE CROSS/BLUE SHIELD

## 2016-11-14 ENCOUNTER — Other Ambulatory Visit: Payer: Self-pay | Admitting: Adult Health

## 2016-11-14 DIAGNOSIS — Z170 Estrogen receptor positive status [ER+]: Principal | ICD-10-CM

## 2016-11-14 DIAGNOSIS — C50911 Malignant neoplasm of unspecified site of right female breast: Secondary | ICD-10-CM | POA: Diagnosis not present

## 2016-11-14 LAB — CBC WITH DIFFERENTIAL/PLATELET
BASO%: 1.1 % (ref 0.0–2.0)
Basophils Absolute: 0.1 10*3/uL (ref 0.0–0.1)
EOS%: 2.5 % (ref 0.0–7.0)
Eosinophils Absolute: 0.2 10*3/uL (ref 0.0–0.5)
HEMATOCRIT: 41.8 % (ref 34.8–46.6)
HGB: 14.3 g/dL (ref 11.6–15.9)
LYMPH#: 2.4 10*3/uL (ref 0.9–3.3)
LYMPH%: 28.7 % (ref 14.0–49.7)
MCH: 29.7 pg (ref 25.1–34.0)
MCHC: 34.1 g/dL (ref 31.5–36.0)
MCV: 87.1 fL (ref 79.5–101.0)
MONO#: 0.7 10*3/uL (ref 0.1–0.9)
MONO%: 8.2 % (ref 0.0–14.0)
NEUT%: 59.5 % (ref 38.4–76.8)
NEUTROS ABS: 5 10*3/uL (ref 1.5–6.5)
PLATELETS: 305 10*3/uL (ref 145–400)
RBC: 4.8 10*6/uL (ref 3.70–5.45)
RDW: 13.7 % (ref 11.2–14.5)
WBC: 8.3 10*3/uL (ref 3.9–10.3)

## 2016-11-14 LAB — COMPREHENSIVE METABOLIC PANEL
ALT: 43 U/L (ref 0–55)
ANION GAP: 13 meq/L — AB (ref 3–11)
AST: 33 U/L (ref 5–34)
Albumin: 3.8 g/dL (ref 3.5–5.0)
Alkaline Phosphatase: 128 U/L (ref 40–150)
BILIRUBIN TOTAL: 0.61 mg/dL (ref 0.20–1.20)
BUN: 10.4 mg/dL (ref 7.0–26.0)
CALCIUM: 10 mg/dL (ref 8.4–10.4)
CO2: 22 mEq/L (ref 22–29)
CREATININE: 0.9 mg/dL (ref 0.6–1.1)
Chloride: 106 mEq/L (ref 98–109)
EGFR: 74 mL/min/{1.73_m2} — ABNORMAL LOW (ref >90)
Glucose: 155 mg/dl — ABNORMAL HIGH (ref 70–140)
Potassium: 3.6 mEq/L (ref 3.5–5.1)
Sodium: 141 mEq/L (ref 136–145)
TOTAL PROTEIN: 7.7 g/dL (ref 6.4–8.3)

## 2016-11-21 ENCOUNTER — Ambulatory Visit (HOSPITAL_BASED_OUTPATIENT_CLINIC_OR_DEPARTMENT_OTHER): Payer: BLUE CROSS/BLUE SHIELD | Admitting: Oncology

## 2016-11-21 VITALS — BP 138/83 | HR 106 | Temp 98.3°F | Resp 18 | Ht 61.0 in | Wt 194.1 lb

## 2016-11-21 DIAGNOSIS — Z853 Personal history of malignant neoplasm of breast: Secondary | ICD-10-CM | POA: Diagnosis not present

## 2016-11-21 DIAGNOSIS — F419 Anxiety disorder, unspecified: Secondary | ICD-10-CM

## 2016-11-21 DIAGNOSIS — Z170 Estrogen receptor positive status [ER+]: Principal | ICD-10-CM

## 2016-11-21 DIAGNOSIS — C50911 Malignant neoplasm of unspecified site of right female breast: Secondary | ICD-10-CM

## 2016-11-21 NOTE — Progress Notes (Signed)
[name]  [phone] Fax:([phone]) [phone]  OFFICE PROGRESS NOTE   [name]   DOB: 08/17/1965  MR#: [id number]  JYN#:[id number]   PCP: Susy Frizzle, [name], [name], [name]  HISTORY OF PRESENT ILLNESS: From Dr. Collier [name]'s new patient evaluation note dated 03/07/2006:  "This is a pleasant 51 year old woman from [facility] referred by Dr. Brantley Stage for evaluation and treatment of breast cancer.  This woman has been in reasonably good health.  She noted a breast mass on June of this year and by August this area had become tender.  Bilateral diagnostic mammogram with right breast ultrasound was performed on 12/25/2005 and physical examination showed discrete thickening in right breast most pronounced at 12'o clock.  Ultrasound showed diffuse shadow in the breast most pronounced at 12'o clock.  An ultrasound and core biopsy was recommended.  Biopsy performed on 12/25/2005 showed invasive mammary carcinoma.  Lobular features were identified.  The tumor was strongly ER and PR positive at 94% and 98% respectively.  Tumor was HER2/neu 1+, FISH negative, and with a proliferative index of 11%.  MRI scan done on 01/04/2006 showed an enhancing mass in the upper inner quadrant of the right breast measuring 4.9 x 2.9 x 2.8 cm.  Lesion involves the subareolar area without directly involving the right nipple.  Images of the left breast show small intramammary lymph node at the lateral aspect of the breast.  No other abnormalities were seen.  After some discussion, the patient elected to undergo a mastectomy, which occurred on 02/02/2006.  Final pathology showed a 4.5 cm grade 2/3 ductal cancer, with vascular invasion was seen.  Surgical margins were clear.  A total of 5 sentinel lymph nodes were identified, two of which had metastatic carcinoma.  Postoperative course has been complicated by a port impaction and so her port has been removed.  She  has been on antibiotics, I believe she was on amoxicillin and now on doxycycline.  At the time of her port placement, she was thought to have an irregular heart rate."    Her subsequent history is as detailed below.  INTERVAL HISTORY: [name] returns today for follow-up and treatment of her estrogen receptor positive breast cancer. She continues on letrozole, with generally good tolerance.  Hot flashes and vaginal dryness are not a major issue. She never developed the arthralgias or myalgias that many patients can experience on this medication. She obtains it at a good price.  ROS: She describes herself is mildly fatigued. She has some crampy pains at times, but this is very inconstant. She has a history of palpitations. She has some joint pains here and there which are not more intense or persistent than usual. She admits to anxiety but is not depressed. A detailed review of systems today was otherwise stable  PAST MEDICAL HISTORY: Past Medical History:  Diagnosis Date  . Breast cancer ([place]) 12/2005   Right breast  . Cancer ([place]) 02/2007   Renal cell carcinoma - Left kidney  . Fibromyalgia   . Hypertension   . Insomnia   . Ruptured disc, cervical   History of fibromyalgia with restless legs syndrome.  PAST SURGICAL HISTORY: Past Surgical History:  Procedure Laterality Date  . CERVICAL DISC SURGERY    . CESAREAN SECTION     C-section x 3  . KNEE ARTHROSCOPY Right 1998  . MASTECTOMY Right 02/02/2006  . PARTIAL NEPHRECTOMY Left 06/11/2006  . PORT-A-CATH REMOVAL    .  SALPINGOOPHORECTOMY    Includes, neck surgery in March 2003, kidney stone lithotripsy in 2003, 1999 and 1998, and three cesarean sections, and knee surgery in 1998.  FAMILY HISTORY Family History  Problem Relation Age of Onset  . Hypertension Mother   . Diabetes Father   . Stroke Father   . Hypertension Brother   . Cancer Maternal Aunt        Ovarian cancer  . Cancer Maternal Uncle        Colon Cancer  . Cancer  Paternal Uncle        Brain cancer  . Cancer Maternal Grandmother        Unknown cancer origin  . Hypertension Brother   . Cancer Maternal Aunt        Colon Cancer  Two brothers and two sisters in good health.  Mother who is with her today is in good health.  There is a history of ovarian cancer in the maternal aunt and maternal grandmother may have had some form of intestinal cancer.   GYNECOLOGIC HISTORY: She is gravida 5, para 3 with two miscarriages. She has a history of menometrorrhagia.   SOCIAL HISTORY:  her husband [name]  is a Theme park manager.  They've been married for 30+years.  They have 3 adult children, 2 daughters and one son. One of her daughters and her son live at home with her and her husband. 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  In her spare time she likes to read, go to church, and cook.   ADVANCED DIRECTIVES: Not on file  HEALTH MAINTENANCE: Social History  Substance Use Topics  . Smoking status: Former Smoker    Types: Cigarettes  . Smokeless tobacco: Never Used     Comment: Quit over 20 years ago  . Alcohol use No    Colonoscopy: Due 2016 PAP:  Bone density: Last bone density scan on 02/14/2012 showed a T score of -0.8  (normal). Lipid panel:  Allergies  Allergen Reactions  . Codeine Nausea And Vomiting    Current Outpatient Prescriptions  Medication Sig Dispense Refill  . ALPRAZolam (XANAX) 0.5 MG tablet Take 1 tablet (0.5 mg total) by mouth at bedtime as needed for anxiety. 30 tablet 0  . cyclobenzaprine (FLEXERIL) 10 MG tablet TAKE 1 TABLET BY MOUTH 3 TIMES A DAY AS NEEDED FOR MUSCLE SPASMS 30 tablet 2  . gabapentin (NEURONTIN) 600 MG tablet take 1 tablet by mouth three times a day 90 tablet 5  . letrozole (FEMARA) 2.5 MG tablet Take 1 tablet (2.5 mg total) by mouth daily. 90 tablet 3  . letrozole (FEMARA) 2.5 MG tablet TAKE 1 TABLET BY MOUTH ONCE DAILY 90 tablet 3  .  losartan-hydrochlorothiazide (HYZAAR) 100-12.5 MG tablet take 1 tablet by mouth once daily 90 tablet 3  . potassium chloride SA (K-DUR,KLOR-CON) 20 MEQ tablet Take 1 tablet (20 mEq total) by mouth daily. 30 tablet 5  . pravastatin (PRAVACHOL) 40 MG tablet take 1 tablet by mouth once daily 30 tablet 5  . zolpidem (AMBIEN) 10 MG tablet take 1 tablet by mouth at bedtime if needed 30 tablet 5   No current facility-administered medications for this visit.     OBJECTIVE: Middle-aged white woma who appears stated age  14:   11/21/16 1030  BP: 138/83  Pulse: (!) 106  Resp: 18  Temp: 98.3 F (36.8 C)     Body mass index is 36.67 kg/m.  ECOG FS: 0 - Asymptomatic  Sclerae unicteric, EOMs intact Oropharynx clear and moist No cervical or supraclavicular adenopathy Lungs no rales or rhonchi Heart regular rate and rhythm Abd soft, nontender, positive bowel sounds MSK no focal spinal tenderness, no upper extremity lymphedema Neuro: nonfocal, well oriented, appropriate affect Breasts: The right breast is status post mastectomy with no evidence of chest wall recurrence. The left breast is unremarkable. Both axillae are benign.  LAB RESULTS: Lab Results  Component Value Date   WBC 8.3 11/14/2016   NEUTROABS 5.0 11/14/2016   HGB 14.3 11/14/2016   HCT 41.8 11/14/2016   MCV 87.1 11/14/2016   PLT 305 11/14/2016      Chemistry      Component Value Date/Time   NA 141 11/14/2016 1013   K 3.6 11/14/2016 1013   CL 102 06/23/2015 1106   CL 105 09/30/2012 1207   CO2 22 11/14/2016 1013   BUN 10.4 11/14/2016 1013   CREATININE 0.9 11/14/2016 1013      Component Value Date/Time   CALCIUM 10.0 11/14/2016 1013   ALKPHOS 128 11/14/2016 1013   AST 33 11/14/2016 1013   ALT 43 11/14/2016 1013   BILITOT 0.61 11/14/2016 1013       Lab Results  Component Value Date   LABCA2 17 10/31/2011    Urinalysis No results found for: COLORURINE  STUDIES: No results found.  ASSESSMENT: 51  y.o. [place], [place] woman:  1.  .  Status post right breast mastectomy with sentinel node biopsy on 02/02/2006, for a stage IIB, pT2 pN1, 4.5 cm invasive ductal carcinoma with lobular features grade 2, ductal carcinoma in situ, ER 94%, PR 90%, Ki-67 11%, HER-2/neu by FISH no amplification, 2/5 positive metastatic lymph nodes.  2.  Status post right axillary regional resection of lymph nodes on 03/27/2006 which showed 2/13 metastatic lymph nodes.  3.  History of renal cell carcinoma status post partial nephrectomy on 06/11/2006.    4. Status post bilateral salpingo-oophorectomy on 06/11/2006 also.  5.  Status post adjuvant chemotherapy with FEC x 3 cycles with Neulasta support from 07/02/2006 through 08/13/2006.  This was followed by adjuvant chemotherapy with Taxotere with Neulasta support x 3 cycles from 09/03/2006 through 10/15/2006.    6.  Status post radiation therapy from 11/06/2006 through 12/21/2006.  7.  The patient started antiestrogen therapy with Arimidex from 12/2006 until 01/2008.  The patient was then switched to antiestrogen therapy with Femara in 01/2008.  (a) density February 2017 shows a T score of -2.0 (stable).  PLAN: Larayah Is now 11 years out from definitive surgery for her breast cancer with no evidence of disease recurrence. This is very favorable.  We discussed the recent data that shows that 7 years of an aromatase inhibitor is equivalent to 10 and that there is no additional benefit in the additional years after 7.  Accordingly I am comfortable with her stopping letrozole and with releasing her to her primary care physician. All she will need in terms of breast cancer follow-up is yearly left mammography and yearly physician breast and chest wall exam  I will be glad to see Suanne Marker at any point in the future if on when the need arises, but as of now we are making no further routine appointment for her here.      on the left MAGRINAT,GUSTAV C,  MD  11/21/2016, 10:38 AM

## 2017-01-03 ENCOUNTER — Other Ambulatory Visit: Payer: Self-pay | Admitting: Family Medicine

## 2017-01-03 MED ORDER — PRAVASTATIN SODIUM 40 MG PO TABS: 40.0000 mg | ORAL_TABLET | Freq: Every day | ORAL | 0 refills | Status: DC

## 2017-01-04 ENCOUNTER — Telehealth: Payer: Self-pay | Admitting: Family Medicine

## 2017-01-04 MED ORDER — PRAVASTATIN SODIUM 40 MG PO TABS: 40.0000 mg | ORAL_TABLET | Freq: Every day | ORAL | 0 refills | Status: DC

## 2017-01-04 NOTE — Telephone Encounter (Signed)
Medication called/sent to requested pharmacy and pt requires ov and labs before further refills.

## 2017-01-04 NOTE — Telephone Encounter (Signed)
New Message   *STAT* If patient is at the pharmacy, call can be transferred to refill team.   1. Which medications need to be refilled? (please list name of each medication and dose if known)  pravastatin (PRAVACHOL) 40 MG tablet  Once daily  2. Which pharmacy/location (including street and city if local pharmacy) is medication to be sent to? [facility], [place], [place] [address]  3. Do they need a 30 day or 90 day supply?  30 day supply  Pt voiced needing refill faxed to pharmacy above instead of pharmacy listed.

## 2017-01-09 ENCOUNTER — Encounter: Payer: Self-pay | Admitting: Family Medicine

## 2017-01-09 ENCOUNTER — Telehealth: Payer: Self-pay | Admitting: Family Medicine

## 2017-01-09 DIAGNOSIS — E559 Vitamin D deficiency, unspecified: Secondary | ICD-10-CM

## 2017-01-09 MED ORDER — ZOLPIDEM TARTRATE 10 MG PO TABS: ORAL_TABLET | ORAL | 0 refills | Status: DC

## 2017-01-09 NOTE — Telephone Encounter (Signed)
NTBS, ok with temp refill

## 2017-01-09 NOTE — Telephone Encounter (Signed)
Medication called/sent to requested pharmacy and letter sent to pt to schedule ov

## 2017-01-09 NOTE — Telephone Encounter (Signed)
Pharmacy sent request for [place] to refill??      LOV 09/07/15 pt needs ov

## 2017-01-31 ENCOUNTER — Other Ambulatory Visit: Payer: BLUE CROSS/BLUE SHIELD

## 2017-01-31 ENCOUNTER — Other Ambulatory Visit: Payer: Self-pay | Admitting: Family Medicine

## 2017-01-31 DIAGNOSIS — I10 Essential (primary) hypertension: Secondary | ICD-10-CM

## 2017-01-31 DIAGNOSIS — E785 Hyperlipidemia, unspecified: Secondary | ICD-10-CM | POA: Diagnosis not present

## 2017-01-31 DIAGNOSIS — M8580 Other specified disorders of bone density and structure, unspecified site: Secondary | ICD-10-CM

## 2017-01-31 DIAGNOSIS — E559 Vitamin D deficiency, unspecified: Secondary | ICD-10-CM

## 2017-01-31 DIAGNOSIS — Z79899 Other long term (current) drug therapy: Secondary | ICD-10-CM

## 2017-01-31 LAB — COMPLETE METABOLIC PANEL WITH GFR
AG RATIO: 1.5 (calc) (ref 1.0–2.5)
ALT: 33 U/L — AB (ref 6–29)
AST: 29 U/L (ref 10–35)
Albumin: 4.1 g/dL (ref 3.6–5.1)
Alkaline phosphatase (APISO): 107 U/L (ref 33–130)
BILIRUBIN TOTAL: 0.6 mg/dL (ref 0.2–1.2)
BUN: 15 mg/dL (ref 7–25)
CHLORIDE: 104 mmol/L (ref 98–110)
CO2: 22 mmol/L (ref 20–32)
Calcium: 9.2 mg/dL (ref 8.6–10.4)
Creat: 0.91 mg/dL (ref 0.50–1.05)
GFR, EST AFRICAN AMERICAN: 85 mL/min/{1.73_m2} (ref >=60)
GFR, Est Non African American: 73 mL/min/{1.73_m2} (ref >=60)
GLOBULIN: 2.8 g/dL (ref 1.9–3.7)
Glucose, Bld: 98 mg/dL (ref 65–99)
POTASSIUM: 3.8 mmol/L (ref 3.5–5.3)
SODIUM: 138 mmol/L (ref 135–146)
Total Protein: 6.9 g/dL (ref 6.1–8.1)

## 2017-02-01 ENCOUNTER — Other Ambulatory Visit: Payer: Self-pay | Admitting: Family Medicine

## 2017-02-01 LAB — LIPID PANEL
Cholesterol: 142 mg/dL (ref <200)
HDL: 49 mg/dL — AB (ref >50)
LDL Cholesterol (Calc): 72 mg/dL (calc)
Non-HDL Cholesterol (Calc): 93 mg/dL (calc) (ref <130)
TRIGLYCERIDES: 133 mg/dL (ref <150)
Total CHOL/HDL Ratio: 2.9 (calc) (ref <5.0)

## 2017-02-01 LAB — VITAMIN D 25 HYDROXY (VIT D DEFICIENCY, FRACTURES): Vit D, 25-Hydroxy: 25 ng/mL — ABNORMAL LOW (ref 30–100)

## 2017-02-01 NOTE — Telephone Encounter (Signed)
Medication refilled per protocol. 

## 2017-02-02 ENCOUNTER — Encounter: Payer: Self-pay | Admitting: Family Medicine

## 2017-02-02 ENCOUNTER — Ambulatory Visit (INDEPENDENT_AMBULATORY_CARE_PROVIDER_SITE_OTHER): Payer: BLUE CROSS/BLUE SHIELD | Admitting: Family Medicine

## 2017-02-02 VITALS — BP 136/96 | HR 76 | Temp 98.4°F | Resp 18 | Wt 192.0 lb

## 2017-02-02 DIAGNOSIS — Z23 Encounter for immunization: Secondary | ICD-10-CM | POA: Diagnosis not present

## 2017-02-02 DIAGNOSIS — I10 Essential (primary) hypertension: Secondary | ICD-10-CM | POA: Diagnosis not present

## 2017-02-02 DIAGNOSIS — F418 Other specified anxiety disorders: Secondary | ICD-10-CM

## 2017-02-02 DIAGNOSIS — E785 Hyperlipidemia, unspecified: Secondary | ICD-10-CM | POA: Diagnosis not present

## 2017-02-02 DIAGNOSIS — M549 Dorsalgia, unspecified: Secondary | ICD-10-CM

## 2017-02-02 MED ORDER — CYCLOBENZAPRINE HCL 10 MG PO TABS: ORAL_TABLET | ORAL | 2 refills | Status: DC

## 2017-02-02 MED ORDER — ALPRAZOLAM 0.5 MG PO TABS: 0.5000 mg | ORAL_TABLET | Freq: Every evening | ORAL | 0 refills | Status: DC | PRN

## 2017-02-02 NOTE — Progress Notes (Signed)
Subjective:    Patient ID: [name], female    DOB: 1965/09/01, 51 y.o.   MRN: [id number]  HPI  Patient is here today at our request prior to receiving a refill on her Xanax and Flexeril. Patient has not been seen since April 2017. She has a history of hypertension. She is currently on losartan/hydrochlorothiazide. Her blood pressure is 136/96 today however at home she states that her blood pressures consistently in the 130s over 80s to 90. Therefore her blood pressures borderline. She is under more stress. Her daughter recently had a granddaughter and delivery was complicated by eclampsia.  Her son is getting married. She is not exercising. She is not eating healthy. She admits to eating more junk food. She is compliant taking her statin medication for hyperlipidemia. She denies any myalgias or right upper quadrant pain. She would like a refill on her Xanax that she uses sparingly for anxiety. One bottle last her an entire year without refill. She is also requesting a refill on Flexeril which she uses on a daily basis. Honestly she uses it more to help her sleep at night. She has become dependent on Ambien.  The medication no longer makes her feel sleepy but without it she experiences withdrawals. However she takes Flexeril at night, she is able to get to sleep and stay asleep. I see no long-term risk in this and I'm willing to give her Flexeril. She also uses occasionally for low back pain secondary to muscle spasms. Appointment on 01/31/2017  Component Date Value Ref Range Status  . Glucose, Bld 01/31/2017 98  65 - 99 mg/dL Final   Comment: .            Fasting reference interval .   . BUN 01/31/2017 15  7 - 25 mg/dL Final  . Creat 01/31/2017 0.91  0.50 - 1.05 mg/dL Final   Comment: For patients >26 years of age, the reference limit for Creatinine is approximately 13% higher for people identified as African-American. .   . GFR, Est Non African American 01/31/2017 73  > OR = 60  mL/min/1.29m2 Final  . GFR, Est African American 01/31/2017 85  > OR = 60 mL/min/1.39m2 Final  . BUN/Creatinine Ratio 21/19/4174 NOT APPLICABLE  6 - 22 (calc) Final  . Sodium 01/31/2017 138  135 - 146 mmol/L Final  . Potassium 01/31/2017 3.8  3.5 - 5.3 mmol/L Final  . Chloride 01/31/2017 104  98 - 110 mmol/L Final  . CO2 01/31/2017 22  20 - 32 mmol/L Final  . Calcium 01/31/2017 9.2  8.6 - 10.4 mg/dL Final  . Total Protein 01/31/2017 6.9  6.1 - 8.1 g/dL Final  . Albumin 01/31/2017 4.1  3.6 - 5.1 g/dL Final  . Globulin 01/31/2017 2.8  1.9 - 3.7 g/dL (calc) Final  . AG Ratio 01/31/2017 1.5  1.0 - 2.5 (calc) Final  . Total Bilirubin 01/31/2017 0.6  0.2 - 1.2 mg/dL Final  . Alkaline phosphatase (APISO) 01/31/2017 107  33 - 130 U/L Final  . AST 01/31/2017 29  10 - 35 U/L Final  . ALT 01/31/2017 33* 6 - 29 U/L Final  . Cholesterol 01/31/2017 142  <200 mg/dL Final  . HDL 01/31/2017 49* >50 mg/dL Final  . Triglycerides 01/31/2017 133  <150 mg/dL Final  . LDL Cholesterol (Calc) 01/31/2017 72  mg/dL (calc) Final   Comment: Reference range: <100 . Desirable range <100 mg/dL for primary prevention;   <70 mg/dL for patients with CHD or  diabetic patients  with > or = 2 CHD risk factors. Marland Kitchen LDL-C is now calculated using the Martin-Hopkins  calculation, which is a validated novel method providing  better accuracy than the Friedewald equation in the  estimation of LDL-C.  Cresenciano Genre et al. Annamaria Helling. 0867;619(50): 2061-2068  (http://education.[facility].com/faq/FAQ164)   . Total CHOL/HDL Ratio 01/31/2017 2.9  <5.0 (calc) Final  . Non-HDL Cholesterol (Calc) 01/31/2017 93  <130 mg/dL (calc) Final   Comment: For patients with diabetes plus 1 major ASCVD risk  factor, treating to a non-HDL-C goal of <100 mg/dL  (LDL-C of <70 mg/dL) is considered a therapeutic  option.   . Vit D, 25-Hydroxy 01/31/2017 25* 30 - 100 ng/mL Final   Comment: Vitamin D Status         25-OH Vitamin D: . Deficiency:                     <20 ng/mL Insufficiency:             20 - 29 ng/mL Optimal:                 > or = 30 ng/mL . For 25-OH Vitamin D testing on patients on  D2-supplementation and patients for whom quantitation  of D2 and D3 fractions is required, the QuestAssureD(TM) 25-OH VIT D, (D2,D3), LC/MS/MS is recommended: order  code [id number] (patients >61yrs). . For more information on this test, go to: http://education.quest[facility].com/faq/FAQ163 (This link is being provided for  informational/educational purposes only.)     Past Medical History:  Diagnosis Date  . Breast cancer ([place]) 12/2005   Right breast  . Cancer ([place]) 02/2007   Renal cell carcinoma - Left kidney  . Fibromyalgia   . Hypertension   . Insomnia   . Ruptured disc, cervical    Past Surgical History:  Procedure Laterality Date  . CERVICAL DISC SURGERY    . CESAREAN SECTION     C-section x 3  . KNEE ARTHROSCOPY Right 1998  . MASTECTOMY Right 02/02/2006  . PARTIAL NEPHRECTOMY Left 06/11/2006  . PORT-A-CATH REMOVAL    . SALPINGOOPHORECTOMY     Current Outpatient Prescriptions on File Prior to Visit  Medication Sig Dispense Refill  . gabapentin (NEURONTIN) 600 MG tablet take 1 tablet by mouth three times a day 90 tablet 5  . losartan-hydrochlorothiazide (HYZAAR) 100-12.5 MG tablet take 1 tablet by mouth once daily 90 tablet 3  . potassium chloride SA (K-DUR,KLOR-CON) 20 MEQ tablet Take 1 tablet (20 mEq total) by mouth daily. 30 tablet 5  . pravastatin (PRAVACHOL) 40 MG tablet TAKE 1 TABLET BY MOUTH DAILY 90 tablet 1  . zolpidem (AMBIEN) 10 MG tablet take 1 tablet by mouth at bedtime if needed 30 tablet 0   No current facility-administered medications on file prior to visit.    Allergies  Allergen Reactions  . Codeine Nausea And Vomiting   Social History   Social History  . Marital status: Married    Spouse name: N/A  . Number of children: N/A  . Years of education: N/A   Occupational History  . Not on file.     Social History Main Topics  . Smoking status: Former Smoker    Types: Cigarettes  . Smokeless tobacco: Never Used     Comment: Quit over 20 years ago  . Alcohol use No  . Drug use: No  . Sexual activity: Yes    Birth control/ protection: Surgical   Other Topics Concern  .  Not on file   Social History Narrative  . No narrative on file     Review of Systems  All other systems reviewed and are negative.      Objective:   Physical Exam  Constitutional: She appears well-developed and well-nourished.  HENT:  Right Ear: External ear normal.  Left Ear: External ear normal.  Nose: Nose normal.  Mouth/Throat: Oropharynx is clear and moist. No oropharyngeal exudate.  Neck: Neck supple.  Cardiovascular: Normal rate, regular rhythm and normal heart sounds.   Pulmonary/Chest: Effort normal and breath sounds normal. No respiratory distress. She has no wheezes. She has no rales.  Abdominal: Soft. Bowel sounds are normal.  Lymphadenopathy:    She has no cervical adenopathy.  Vitals reviewed.         Assessment & Plan:  Need for prophylactic vaccination and inoculation against influenza - Plan: Flu Vaccine QUAD 6+ mos PF IM (Fluarix Quad PF)  Situational anxiety - Plan: ALPRAZolam (XANAX) 0.5 MG tablet  Mid-back pain, acute - Plan: cyclobenzaprine (FLEXERIL) 10 MG tablet  Benign essential HTN  Hyperlipidemia, unspecified hyperlipidemia type  The patient's blood pressure is adequate. I'll make no changes in her Hyzaar. I have asked her to begin exercising 30 minutes a day both to help manage her blood pressure, reduce her weight, and help manage her anxiety and stress. I have also asked her to improve her diet. She needs to reduce her consumption of junk food, saturated fat, and carbohydrates. She needs to eat more fresh fruits and vegetables. Her cholesterol is excellent. I will make no changes in her statin dose. I did refill her Xanax which she uses sparingly as needed for  anxiety. Also refill her Flexeril which she uses almost on a daily basis to help her sleep and occasionally for low back pain. Patient received her flu shot today.

## 2017-02-14 ENCOUNTER — Other Ambulatory Visit: Payer: Self-pay

## 2017-02-14 DIAGNOSIS — E559 Vitamin D deficiency, unspecified: Secondary | ICD-10-CM

## 2017-02-14 NOTE — Telephone Encounter (Signed)
Last Ov 9/21 Lat refill 8/28 Ok to refill?

## 2017-02-15 MED ORDER — ZOLPIDEM TARTRATE 10 MG PO TABS: ORAL_TABLET | ORAL | 0 refills | Status: DC

## 2017-02-15 NOTE — Telephone Encounter (Signed)
rx called in to pharamcy

## 2017-02-15 NOTE — Telephone Encounter (Signed)
ok 

## 2017-02-16 ENCOUNTER — Telehealth: Payer: Self-pay | Admitting: Family Medicine

## 2017-02-16 DIAGNOSIS — E559 Vitamin D deficiency, unspecified: Secondary | ICD-10-CM

## 2017-02-16 MED ORDER — ZOLPIDEM TARTRATE 10 MG PO TABS: ORAL_TABLET | ORAL | 0 refills | Status: DC

## 2017-02-16 NOTE — Telephone Encounter (Signed)
Pharmacy has been changed. Original pharmacy  request was cancelled.Patient is aware

## 2017-02-16 NOTE — Telephone Encounter (Signed)
Please resend [place] to [facility] [address]

## 2017-03-07 ENCOUNTER — Other Ambulatory Visit: Payer: Self-pay | Admitting: Family Medicine

## 2017-03-07 MED ORDER — GABAPENTIN 600 MG PO TABS: 600.0000 mg | ORAL_TABLET | Freq: Three times a day (TID) | ORAL | 5 refills | Status: DC

## 2017-03-14 ENCOUNTER — Other Ambulatory Visit: Payer: Self-pay | Admitting: Family Medicine

## 2017-03-14 DIAGNOSIS — E559 Vitamin D deficiency, unspecified: Secondary | ICD-10-CM

## 2017-03-15 MED ORDER — ZOLPIDEM TARTRATE 10 MG PO TABS: ORAL_TABLET | ORAL | 3 refills | Status: DC

## 2017-03-15 NOTE — Telephone Encounter (Signed)
Medication called/sent to requested pharmacy  

## 2017-05-15 ENCOUNTER — Other Ambulatory Visit: Payer: Self-pay | Admitting: Family Medicine

## 2017-05-15 DIAGNOSIS — M549 Dorsalgia, unspecified: Secondary | ICD-10-CM

## 2017-05-16 NOTE — Telephone Encounter (Signed)
Ok to refill 

## 2017-06-16 ENCOUNTER — Other Ambulatory Visit: Payer: Self-pay | Admitting: Family Medicine

## 2017-06-16 DIAGNOSIS — M549 Dorsalgia, unspecified: Secondary | ICD-10-CM

## 2017-06-18 NOTE — Telephone Encounter (Signed)
Requesting refill    Flexeril  LOV:  02/02/17  LRF:  05/17/17

## 2017-07-12 ENCOUNTER — Other Ambulatory Visit: Payer: Self-pay | Admitting: Family Medicine

## 2017-07-12 DIAGNOSIS — E559 Vitamin D deficiency, unspecified: Secondary | ICD-10-CM

## 2017-07-13 NOTE — Telephone Encounter (Signed)
Ok to refill??  Last office visit 02/02/2017.  Last refill 03/15/2017, #3 refills.

## 2017-07-29 ENCOUNTER — Other Ambulatory Visit: Payer: Self-pay | Admitting: Family Medicine

## 2017-08-11 ENCOUNTER — Other Ambulatory Visit: Payer: Self-pay | Admitting: Family Medicine

## 2017-08-11 DIAGNOSIS — M549 Dorsalgia, unspecified: Secondary | ICD-10-CM

## 2017-08-13 ENCOUNTER — Other Ambulatory Visit: Payer: Self-pay | Admitting: Family Medicine

## 2017-08-13 DIAGNOSIS — M549 Dorsalgia, unspecified: Secondary | ICD-10-CM

## 2017-08-13 NOTE — Telephone Encounter (Signed)
Ok to refill 

## 2017-08-20 ENCOUNTER — Other Ambulatory Visit: Payer: Self-pay | Admitting: Family Medicine

## 2017-08-20 DIAGNOSIS — I10 Essential (primary) hypertension: Secondary | ICD-10-CM

## 2017-08-20 MED ORDER — LOSARTAN POTASSIUM-HCTZ 100-12.5 MG PO TABS: 1.0000 | ORAL_TABLET | Freq: Every day | ORAL | 1 refills | Status: DC

## 2017-09-12 ENCOUNTER — Other Ambulatory Visit: Payer: Self-pay | Admitting: Family Medicine

## 2017-09-12 DIAGNOSIS — M549 Dorsalgia, unspecified: Secondary | ICD-10-CM

## 2017-09-13 NOTE — Telephone Encounter (Signed)
Requesting refill    Flexeril  LOV: 02/02/17  LRF:  08/13/17

## 2017-10-09 ENCOUNTER — Other Ambulatory Visit: Payer: Self-pay | Admitting: Family Medicine

## 2017-10-09 DIAGNOSIS — M549 Dorsalgia, unspecified: Secondary | ICD-10-CM

## 2017-10-10 NOTE — Telephone Encounter (Signed)
Ok to refill 

## 2017-11-07 ENCOUNTER — Other Ambulatory Visit: Payer: Self-pay | Admitting: Family Medicine

## 2017-11-07 DIAGNOSIS — M549 Dorsalgia, unspecified: Secondary | ICD-10-CM

## 2017-11-07 DIAGNOSIS — E559 Vitamin D deficiency, unspecified: Secondary | ICD-10-CM

## 2017-11-08 NOTE — Telephone Encounter (Signed)
Requesting refill  Xanax & Flexeril    LOV: 02/02/17  LRF:  07/13/17 Ambien & 10/11/17 Xanax

## 2018-01-08 ENCOUNTER — Other Ambulatory Visit: Payer: Self-pay | Admitting: Physician Assistant

## 2018-01-08 DIAGNOSIS — M549 Dorsalgia, unspecified: Secondary | ICD-10-CM

## 2018-01-09 NOTE — Telephone Encounter (Signed)
Last OV 02/02/2018 Last refill 11/08/2017 Ok to refill?

## 2018-01-19 ENCOUNTER — Other Ambulatory Visit: Payer: Self-pay | Admitting: Family Medicine

## 2018-02-09 ENCOUNTER — Other Ambulatory Visit: Payer: Self-pay | Admitting: Family Medicine

## 2018-02-09 DIAGNOSIS — I10 Essential (primary) hypertension: Secondary | ICD-10-CM

## 2018-02-10 ENCOUNTER — Other Ambulatory Visit: Payer: Self-pay | Admitting: Physician Assistant

## 2018-02-10 ENCOUNTER — Other Ambulatory Visit: Payer: Self-pay | Admitting: Family Medicine

## 2018-02-10 DIAGNOSIS — M549 Dorsalgia, unspecified: Secondary | ICD-10-CM

## 2018-02-10 DIAGNOSIS — E559 Vitamin D deficiency, unspecified: Secondary | ICD-10-CM

## 2018-02-11 NOTE — Telephone Encounter (Signed)
She sees Dr. Dennard Schaumann.   Her last visit was 01/2017.   She is also on meds for hypertension hyperlipidemia etc.   Needs to come in for OV and labs. Will not refill until visit scheduled.  Denied.

## 2018-02-11 NOTE — Telephone Encounter (Signed)
Last OV 02/02/2017 Last refill 11/08/2017 Ok to refill?

## 2018-02-14 ENCOUNTER — Telehealth: Payer: Self-pay | Admitting: Family Medicine

## 2018-02-15 ENCOUNTER — Ambulatory Visit: Payer: BLUE CROSS/BLUE SHIELD | Admitting: Family Medicine

## 2018-02-15 ENCOUNTER — Encounter: Payer: Self-pay | Admitting: Family Medicine

## 2018-02-15 VITALS — BP 136/100 | HR 98 | Temp 98.3°F | Resp 18 | Ht 61.0 in | Wt 196.0 lb

## 2018-02-15 DIAGNOSIS — E559 Vitamin D deficiency, unspecified: Secondary | ICD-10-CM

## 2018-02-15 DIAGNOSIS — M8580 Other specified disorders of bone density and structure, unspecified site: Secondary | ICD-10-CM

## 2018-02-15 DIAGNOSIS — F418 Other specified anxiety disorders: Secondary | ICD-10-CM | POA: Diagnosis not present

## 2018-02-15 DIAGNOSIS — R7309 Other abnormal glucose: Secondary | ICD-10-CM | POA: Diagnosis not present

## 2018-02-15 DIAGNOSIS — I10 Essential (primary) hypertension: Secondary | ICD-10-CM | POA: Diagnosis not present

## 2018-02-15 DIAGNOSIS — Z853 Personal history of malignant neoplasm of breast: Secondary | ICD-10-CM

## 2018-02-15 DIAGNOSIS — E785 Hyperlipidemia, unspecified: Secondary | ICD-10-CM

## 2018-02-15 DIAGNOSIS — Z23 Encounter for immunization: Secondary | ICD-10-CM

## 2018-02-15 DIAGNOSIS — Z1211 Encounter for screening for malignant neoplasm of colon: Secondary | ICD-10-CM

## 2018-02-15 MED ORDER — GABAPENTIN 600 MG PO TABS: 600.0000 mg | ORAL_TABLET | Freq: Three times a day (TID) | ORAL | 3 refills | Status: DC

## 2018-02-15 MED ORDER — ZOLPIDEM TARTRATE 10 MG PO TABS: 10.0000 mg | ORAL_TABLET | Freq: Every day | ORAL | 2 refills | Status: DC

## 2018-02-15 MED ORDER — ALPRAZOLAM 0.5 MG PO TABS: 0.5000 mg | ORAL_TABLET | Freq: Every evening | ORAL | 0 refills | Status: DC | PRN

## 2018-02-15 MED FILL — ZOLPIDEM TARTRATE 10 MG TAB: 10 | 30 days supply | Qty: 30 | Fill #0

## 2018-02-15 MED FILL — ALPRAZolam 0.5 MG TABS: 0.5 | 30 days supply | Qty: 30 | Fill #0

## 2018-02-15 MED FILL — GABAPENTIN 600 MG TABLET: 600 | 30 days supply | Qty: 90 | Fill #0

## 2018-02-15 NOTE — Progress Notes (Signed)
Subjective:    Patient ID: [name], female    DOB: [birth year], 52 y.o.   MRN: [id number]  HPI 02/02/17 Patient is here today at our request prior to receiving a refill on her Xanax and Flexeril. Patient has not been seen since April 2017. She has a history of hypertension. She is currently on losartan/hydrochlorothiazide. Her blood pressure is 136/96 today however at home she states that her blood pressures consistently in the 130s over 80s to 90. Therefore her blood pressures borderline. She is under more stress. Her daughter recently had a granddaughter and delivery was complicated by eclampsia.  Her son is getting married. She is not exercising. She is not eating healthy. She admits to eating more junk food. She is compliant taking her statin medication for hyperlipidemia. She denies any myalgias or right upper quadrant pain. She would like a refill on her Xanax that she uses sparingly for anxiety. One bottle last her an entire year without refill. She is also requesting a refill on Flexeril which she uses on a daily basis. Honestly she uses it more to help her sleep at night. She has become dependent on Ambien.  The medication no longer makes her feel sleepy but without it she experiences withdrawals. However she takes Flexeril at night, she is able to get to sleep and stay asleep. I see no long-term risk in this and I'm willing to give her Flexeril. She also uses occasionally for low back pain secondary to muscle spasms.  At that time, my plan was: The patient's blood pressure is adequate. I'll make no changes in her Hyzaar. I have asked her to begin exercising 30 minutes a day both to help manage her blood pressure, reduce her weight, and help manage her anxiety and stress. I have also asked her to improve her diet. She needs to reduce her consumption of junk food, saturated fat, and carbohydrates. She needs to eat more fresh fruits and vegetables. Her cholesterol is excellent. I will make no  changes in her statin dose. I did refill her Xanax which she uses sparingly as needed for anxiety. Also refill her Flexeril which she uses almost on a daily basis to help her sleep and occasionally for low back pain. Patient received her flu shot today.  02/15/18 Patient has not been seen since last year.  This morning she got a ticket on the way to the office.  She is very upset about this partly explaining her elevated blood pressure.  She has a history of breast cancer status post mastectomy.  However she is overdue for repeat mammogram.  She is also overdue for a colonoscopy.  Apparently this was scheduled last year however the patient was unable to go for her colonoscopy due to family issues and never rescheduled it.  She is also overdue for a Pap smear.  She is due for her flu shot.  She denies any chest pain shortness of breath or dyspnea on exertion.  She denies any myalgias or right upper quadrant pain on her statin.  She has a history of fibromyalgia.  She discontinued gabapentin because she thought it was not beneficial however off the medication she has noticed significant worsening in her pain and she would like to resume gabapentin 600 mg 3 times daily.  She is tried Lyrica in the past however that kept her awake.  She saw no benefit from Cymbalta.  She uses Ambien virtually on a nightly basis to help her sleep.  She  is asking that I refill this as well because without the medication she is unable to sleep.  She shows no evidence of abuse or diversion.  She also uses Xanax sparingly as needed for anxiety.  With the stress that she is under right now she is requesting a limited refill that she can use sparingly as needed to help with stress and anxiety. Past Medical History:  Diagnosis Date  . Breast cancer ([place]) 12/2005   Right breast  . Cancer ([place]) 02/2007   Renal cell carcinoma - Left kidney  . Fibromyalgia   . Hypertension   . Insomnia   . Ruptured disc, cervical    Past Surgical  History:  Procedure Laterality Date  . CERVICAL DISC SURGERY    . CESAREAN SECTION     C-section x 3  . KNEE ARTHROSCOPY Right 1998  . MASTECTOMY Right 02/02/2006  . PARTIAL NEPHRECTOMY Left 06/11/2006  . PORT-A-CATH REMOVAL    . SALPINGOOPHORECTOMY     Current Outpatient Medications on File Prior to Visit  Medication Sig Dispense Refill  . cyclobenzaprine (FLEXERIL) 10 MG tablet TAKE 1 TABLET BY MOUTH THREE TIMES DAILY AS NEEDED FOR MUSCLE SPASMS 30 tablet 0  . losartan-hydrochlorothiazide (HYZAAR) 100-12.5 MG tablet TAKE 1 TABLET BY MOUTH DAILY 90 tablet 0  . potassium chloride SA (K-DUR,KLOR-CON) 20 MEQ tablet Take 1 tablet (20 mEq total) by mouth daily. 30 tablet 5  . pravastatin (PRAVACHOL) 40 MG tablet TAKE 1 TABLET BY MOUTH DAILY 90 tablet 0  . zolpidem (AMBIEN) 10 MG tablet TAKE 1 TABLET BY MOUTH EVERY DAY AT BEDTIME 30 tablet 2  . ALPRAZolam (XANAX) 0.5 MG tablet Take 1 tablet (0.5 mg total) by mouth at bedtime as needed for anxiety. (Patient not taking: Reported on 02/15/2018) 30 tablet 0  . gabapentin (NEURONTIN) 600 MG tablet Take 1 tablet (600 mg total) by mouth 3 (three) times daily. (Patient not taking: Reported on 02/15/2018) 90 tablet 5   No current facility-administered medications on file prior to visit.    Allergies  Allergen Reactions  . Codeine Nausea And Vomiting   Social History   Socioeconomic History  . Marital status: Married    Spouse name: Not on file  . Number of children: Not on file  . Years of education: Not on file  . Highest education level: Not on file  Occupational History  . Not on file  Social Needs  . Financial resource strain: Not on file  . Food insecurity:    Worry: Not on file    Inability: Not on file  . Transportation needs:    Medical: Not on file    Non-medical: Not on file  Tobacco Use  . Smoking status: Former Smoker    Types: Cigarettes  . Smokeless tobacco: Never Used  . Tobacco comment: Quit over 20 years ago    Substance and Sexual Activity  . Alcohol use: No  . Drug use: No  . Sexual activity: Yes    Birth control/protection: Surgical  Lifestyle  . Physical activity:    Days per week: Not on file    Minutes per session: Not on file  . Stress: Not on file  Relationships  . Social connections:    Talks on phone: Not on file    Gets together: Not on file    Attends religious service: Not on file    Active member of club or organization: Not on file    Attends meetings of clubs or organizations:  Not on file    Relationship status: Not on file  . Intimate partner violence:    Fear of current or ex partner: Not on file    Emotionally abused: Not on file    Physically abused: Not on file    Forced sexual activity: Not on file  Other Topics Concern  . Not on file  Social History Narrative  . Not on file     Review of Systems  All other systems reviewed and are negative.      Objective:   Physical Exam  Constitutional: She appears well-developed and well-nourished.  HENT:  Right Ear: External ear normal.  Left Ear: External ear normal.  Nose: Nose normal.  Mouth/Throat: Oropharynx is clear and moist. No oropharyngeal exudate.  Neck: Neck supple.  Cardiovascular: Normal rate, regular rhythm and normal heart sounds.  Pulmonary/Chest: Effort normal and breath sounds normal. No respiratory distress. She has no wheezes. She has no rales.  Abdominal: Soft. Bowel sounds are normal.  Lymphadenopathy:    She has no cervical adenopathy.  Vitals reviewed.         Assessment & Plan:  Benign essential HTN - Plan: CBC with Differential/Platelet, COMPLETE METABOLIC PANEL WITH GFR, Lipid panel  Hyperlipidemia, unspecified hyperlipidemia type - Plan: CBC with Differential/Platelet, COMPLETE METABOLIC PANEL WITH GFR, Lipid panel  Osteopenia, unspecified location  Situational anxiety - Plan: ALPRAZolam (XANAX) 0.5 MG tablet  Vitamin D deficiency - Plan: zolpidem (AMBIEN) 10 MG  tablet  History of breast cancer - Plan: MM Digital Screening  Colon cancer screening - Plan: Ambulatory referral to Gastroenterology  Patient's blood pressure is extremely high today however a lot of this is due to her ticket.  Of asked the patient to check her blood pressure at home and call me with the values in a week.  We can titrate medication if necessary to achieve a blood pressure less than 140/90.  I will check a CMP and a fasting lipid panel to monitor her cholesterol.  Ideally I like her LDL cholesterol below 100.  Because of her osteopenia I recommended calcium and vitamin D.  Patient can use Xanax 0.5 mg p.o. every 8 hours as needed breakthrough anxiety.  However asked the patient uses sparingly and to not mix this with Ambien.  She can use Ambien 10 mg p.o. nightly as needed insomnia.  I will refill this.  I will also refill gabapentin 600 mg p.o. 3 times daily for pain control with fibromyalgia as this seems to benefit the patient.  She declines combining Cymbalta with the gabapentin at the present time.  I will schedule the patient for screening mammogram.  I will also schedule the patient for a colonoscopy.  Patient received her flu shot today.  Recommend that she schedule a Pap smear at her earliest convenience to complete her preventative care

## 2018-02-18 ENCOUNTER — Other Ambulatory Visit: Payer: Self-pay | Admitting: Family Medicine

## 2018-02-18 DIAGNOSIS — R7989 Other specified abnormal findings of blood chemistry: Secondary | ICD-10-CM

## 2018-02-18 DIAGNOSIS — R945 Abnormal results of liver function studies: Principal | ICD-10-CM

## 2018-02-19 LAB — COMPLETE METABOLIC PANEL WITH GFR
AG Ratio: 1.3 (calc) (ref 1.0–2.5)
ALBUMIN MSPROF: 4.2 g/dL (ref 3.6–5.1)
ALT: 46 U/L — ABNORMAL HIGH (ref 6–29)
AST: 56 U/L — ABNORMAL HIGH (ref 10–35)
Alkaline phosphatase (APISO): 93 U/L (ref 33–130)
BUN: 13 mg/dL (ref 7–25)
CALCIUM: 9.7 mg/dL (ref 8.6–10.4)
CHLORIDE: 102 mmol/L (ref 98–110)
CO2: 23 mmol/L (ref 20–32)
Creat: 0.91 mg/dL (ref 0.50–1.05)
GFR, Est African American: 83 mL/min/{1.73_m2} (ref >=60)
GFR, Est Non African American: 72 mL/min/{1.73_m2} (ref >=60)
Globulin: 3.2 g/dL (calc) (ref 1.9–3.7)
Glucose, Bld: 113 mg/dL — ABNORMAL HIGH (ref 65–99)
POTASSIUM: 4.5 mmol/L (ref 3.5–5.3)
SODIUM: 138 mmol/L (ref 135–146)
Total Bilirubin: 0.8 mg/dL (ref 0.2–1.2)
Total Protein: 7.4 g/dL (ref 6.1–8.1)

## 2018-02-19 LAB — LIPID PANEL
CHOLESTEROL: 161 mg/dL (ref <200)
HDL: 47 mg/dL — ABNORMAL LOW (ref >50)
LDL Cholesterol (Calc): 93 mg/dL (calc)
Non-HDL Cholesterol (Calc): 114 mg/dL (calc) (ref <130)
Total CHOL/HDL Ratio: 3.4 (calc) (ref <5.0)
Triglycerides: 113 mg/dL (ref <150)

## 2018-02-19 LAB — CBC WITH DIFFERENTIAL/PLATELET
Basophils Absolute: 80 cells/uL (ref 0–200)
Basophils Relative: 1 %
EOS PCT: 2.6 %
Eosinophils Absolute: 208 cells/uL (ref 15–500)
HEMATOCRIT: 41.6 % (ref 35.0–45.0)
Hemoglobin: 13.9 g/dL (ref 11.7–15.5)
LYMPHS ABS: 2776 {cells}/uL (ref 850–3900)
MCH: 29.6 pg (ref 27.0–33.0)
MCHC: 33.4 g/dL (ref 32.0–36.0)
MCV: 88.7 fL (ref 80.0–100.0)
MONOS PCT: 8.4 %
MPV: 10.4 fL (ref 7.5–12.5)
Neutro Abs: 4264 cells/uL (ref 1500–7800)
Neutrophils Relative %: 53.3 %
PLATELETS: 329 10*3/uL (ref 140–400)
RBC: 4.69 10*6/uL (ref 3.80–5.10)
RDW: 13.1 % (ref 11.0–15.0)
Total Lymphocyte: 34.7 %
WBC mixed population: 672 cells/uL (ref 200–950)
WBC: 8 10*3/uL (ref 3.8–10.8)

## 2018-02-19 LAB — HEPATITIS PANEL, ACUTE
HEP B C IGM: NONREACTIVE
HEP B S AG: NONREACTIVE
HEP C AB: NONREACTIVE
Hep A IgM: NONREACTIVE
SIGNAL TO CUT-OFF: 0.05 (ref <1.00)

## 2018-02-19 LAB — TEST AUTHORIZATION

## 2018-02-19 LAB — HEMOGLOBIN A1C W/OUT EAG: Hgb A1c MFr Bld: 6 % of total Hgb — ABNORMAL HIGH (ref <5.7)

## 2018-02-20 NOTE — Telephone Encounter (Signed)
She has not scheduled an OV.

## 2018-02-22 ENCOUNTER — Ambulatory Visit
Admission: RE | Admit: 2018-02-22 | Discharge: 2018-02-22 | Disposition: A | Discharge status: Home / Self Care | Payer: BLUE CROSS/BLUE SHIELD | Source: Ambulatory Visit | Attending: Family Medicine | Admitting: Family Medicine

## 2018-02-22 DIAGNOSIS — R945 Abnormal results of liver function studies: Principal | ICD-10-CM

## 2018-02-22 DIAGNOSIS — K760 Fatty (change of) liver, not elsewhere classified: Secondary | ICD-10-CM | POA: Diagnosis not present

## 2018-02-22 DIAGNOSIS — R7989 Other specified abnormal findings of blood chemistry: Secondary | ICD-10-CM

## 2018-02-26 ENCOUNTER — Other Ambulatory Visit: Payer: BLUE CROSS/BLUE SHIELD

## 2018-03-14 ENCOUNTER — Telehealth: Payer: Self-pay | Admitting: Family Medicine

## 2018-03-14 NOTE — Telephone Encounter (Signed)
Message sent via mychart  to patient to see which medicine she needed sent to [facility]

## 2018-03-14 NOTE — Telephone Encounter (Signed)
Patient calling to say that her med refills were sent to [facility] and they need to go to [facility] [address]

## 2018-03-15 MED FILL — ZOLPIDEM TARTRATE 10 MG TAB: 10 | 30 days supply | Qty: 30 | Fill #1

## 2018-03-17 ENCOUNTER — Other Ambulatory Visit: Payer: Self-pay | Admitting: Family Medicine

## 2018-03-17 DIAGNOSIS — M549 Dorsalgia, unspecified: Secondary | ICD-10-CM

## 2018-03-18 NOTE — Telephone Encounter (Signed)
Ok to refill 

## 2018-03-21 ENCOUNTER — Encounter: Payer: Self-pay | Admitting: Family Medicine

## 2018-04-05 ENCOUNTER — Ambulatory Visit
Admission: RE | Admit: 2018-04-05 | Discharge: 2018-04-05 | Disposition: A | Discharge status: Home / Self Care | Payer: BLUE CROSS/BLUE SHIELD | Source: Ambulatory Visit | Attending: Family Medicine | Admitting: Family Medicine

## 2018-04-05 ENCOUNTER — Other Ambulatory Visit: Payer: Self-pay | Admitting: Family Medicine

## 2018-04-05 DIAGNOSIS — Z1231 Encounter for screening mammogram for malignant neoplasm of breast: Secondary | ICD-10-CM | POA: Diagnosis not present

## 2018-04-05 DIAGNOSIS — Z853 Personal history of malignant neoplasm of breast: Secondary | ICD-10-CM

## 2018-04-14 ENCOUNTER — Other Ambulatory Visit: Payer: Self-pay | Admitting: Family Medicine

## 2018-04-14 DIAGNOSIS — M549 Dorsalgia, unspecified: Secondary | ICD-10-CM

## 2018-04-15 MED FILL — ZOLPIDEM TARTRATE 10 MG TAB: 10 | 30 days supply | Qty: 30 | Fill #2

## 2018-04-15 NOTE — Telephone Encounter (Signed)
Requesting refill    Flexeril  LOV: 10/4/189  LRF:  03/18/18

## 2018-04-22 ENCOUNTER — Other Ambulatory Visit: Payer: Self-pay | Admitting: *Deleted

## 2018-04-22 MED ORDER — PRAVASTATIN SODIUM 40 MG PO TABS: 40.0000 mg | ORAL_TABLET | Freq: Every day | ORAL | 3 refills | Status: DC

## 2018-04-24 ENCOUNTER — Telehealth: Payer: Self-pay | Admitting: Family Medicine

## 2018-04-24 MED ORDER — PRAVASTATIN SODIUM 40 MG PO TABS: 40.0000 mg | ORAL_TABLET | Freq: Every day | ORAL | 3 refills | Status: DC

## 2018-04-24 NOTE — Telephone Encounter (Signed)
Pt needs [place] to change her pravastatin script to [facility] [address]. Also would like for [place] to take [place] off of her chart if we can.

## 2018-04-24 NOTE — Telephone Encounter (Signed)
Med sent to [facility] and [facility] taken out of chart

## 2018-05-11 ENCOUNTER — Other Ambulatory Visit: Payer: Self-pay | Admitting: Family Medicine

## 2018-05-11 DIAGNOSIS — I10 Essential (primary) hypertension: Secondary | ICD-10-CM

## 2018-05-13 ENCOUNTER — Other Ambulatory Visit: Payer: Self-pay | Admitting: Family Medicine

## 2018-05-13 DIAGNOSIS — E559 Vitamin D deficiency, unspecified: Secondary | ICD-10-CM

## 2018-05-13 NOTE — Telephone Encounter (Signed)
Requesting refill   Ambien  LOV: 02/15/18  LRF:  02/15/18

## 2018-05-14 MED ORDER — ZOLPIDEM TARTRATE 10 MG PO TABS: 10.0000 mg | ORAL_TABLET | Freq: Every day | ORAL | 2 refills | Status: DC

## 2018-07-09 ENCOUNTER — Other Ambulatory Visit: Payer: Self-pay | Admitting: Family Medicine

## 2018-07-09 DIAGNOSIS — M549 Dorsalgia, unspecified: Secondary | ICD-10-CM

## 2018-08-08 ENCOUNTER — Other Ambulatory Visit: Payer: Self-pay | Admitting: Family Medicine

## 2018-08-08 DIAGNOSIS — E559 Vitamin D deficiency, unspecified: Secondary | ICD-10-CM

## 2018-08-09 NOTE — Telephone Encounter (Signed)
Requesting refill    Ambien  LOV: 02/15/18  LRF: 05/14/18

## 2018-09-16 ENCOUNTER — Ambulatory Visit (INDEPENDENT_AMBULATORY_CARE_PROVIDER_SITE_OTHER): Payer: BLUE CROSS/BLUE SHIELD | Admitting: Family Medicine

## 2018-09-16 ENCOUNTER — Other Ambulatory Visit: Payer: Self-pay

## 2018-09-16 DIAGNOSIS — J020 Streptococcal pharyngitis: Secondary | ICD-10-CM

## 2018-09-16 MED ORDER — AMOXICILLIN 875 MG PO TABS: 875.0000 mg | ORAL_TABLET | Freq: Two times a day (BID) | ORAL | 0 refills | Status: DC

## 2018-09-16 NOTE — Progress Notes (Signed)
Subjective:    Patient ID: [name], female    DOB: 24-May-1965, 53 y.o.   MRN: [id number]  HPI Patient is being seen today as a telephone visit.  She is currently at home.  I am currently in my office.  Phone call began at 911.  Phone call ended at 920.  Patient states that she is 56% certain that she has strep throat.  Her grandson and son-in-law have both had strep throat confirmed by lab test.  She was exposed to them prior to them starting antibiotics.  Yesterday she developed a low-grade fever of 100.  She also had headache and a scratchy throat.  This morning the throat is much worse.  She continues to have low-grade fever of 99.7.  She looked in the mirror and she sees white pustules and blisters in the back of her throat.  Her posterior oropharynx is erythematous.  She has tender lymphadenopathy per her report "swollen glands" in her neck.  She denies any cough.  She denies any shortness of breath.  She denies any rhinorrhea.  She denies any nausea or vomiting or diarrhea. Past Medical History:  Diagnosis Date  . Breast cancer ([place]) 12/2005   Right breast  . Cancer ([place]) 02/2007   Renal cell carcinoma - Left kidney  . Fibromyalgia   . Hypertension   . Insomnia   . Ruptured disc, cervical    Past Surgical History:  Procedure Laterality Date  . CERVICAL DISC SURGERY    . CESAREAN SECTION     C-section x 3  . KNEE ARTHROSCOPY Right 1998  . MASTECTOMY Right 02/02/2006  . PARTIAL NEPHRECTOMY Left 06/11/2006  . PORT-A-CATH REMOVAL    . SALPINGOOPHORECTOMY     Current Outpatient Medications on File Prior to Visit  Medication Sig Dispense Refill  . ALPRAZolam (XANAX) 0.5 MG tablet Take 1 tablet (0.5 mg total) by mouth at bedtime as needed for anxiety. 30 tablet 0  . cyclobenzaprine (FLEXERIL) 10 MG tablet TAKE 1 TABLET BY MOUTH THREE TIMES DAILY AS NEEDED FOR MUSCLE SPASMS 30 tablet 2  . gabapentin (NEURONTIN) 600 MG tablet Take 1 tablet (600 mg total) by mouth 3 (three) times  daily. 90 tablet 3  . losartan-hydrochlorothiazide (HYZAAR) 100-12.5 MG tablet TAKE 1 TABLET BY MOUTH DAILY 90 tablet 1  . potassium chloride SA (K-DUR,KLOR-CON) 20 MEQ tablet Take 1 tablet (20 mEq total) by mouth daily. 30 tablet 5  . pravastatin (PRAVACHOL) 40 MG tablet Take 1 tablet (40 mg total) by mouth daily. 90 tablet 3  . zolpidem (AMBIEN) 10 MG tablet TAKE 1 TABLET(10 MG) BY MOUTH AT BEDTIME 30 tablet 2   No current facility-administered medications on file prior to visit.    Allergies  Allergen Reactions  . Codeine Nausea And Vomiting   Social History   Socioeconomic History  . Marital status: Married    Spouse name: Not on file  . Number of children: Not on file  . Years of education: Not on file  . Highest education level: Not on file  Occupational History  . Not on file  Social Needs  . Financial resource strain: Not on file  . Food insecurity:    Worry: Not on file    Inability: Not on file  . Transportation needs:    Medical: Not on file    Non-medical: Not on file  Tobacco Use  . Smoking status: Former Smoker    Types: Cigarettes  . Smokeless tobacco: Never Used  .  Tobacco comment: Quit over 20 years ago  Substance and Sexual Activity  . Alcohol use: No  . Drug use: No  . Sexual activity: Yes    Birth control/protection: Surgical  Lifestyle  . Physical activity:    Days per week: Not on file    Minutes per session: Not on file  . Stress: Not on file  Relationships  . Social connections:    Talks on phone: Not on file    Gets together: Not on file    Attends religious service: Not on file    Active member of club or organization: Not on file    Attends meetings of clubs or organizations: Not on file    Relationship status: Not on file  . Intimate partner violence:    Fear of current or ex partner: Not on file    Emotionally abused: Not on file    Physically abused: Not on file    Forced sexual activity: Not on file  Other Topics Concern  . Not  on file  Social History Narrative  . Not on file      Review of Systems  All other systems reviewed and are negative.      Objective:   Physical Exam   No physical exam could be performed today as the patient was seen as a telephone visit     Assessment & Plan:  Strep throat  Patient's history is consistent with strep throat particular given her exposure.  Begin amoxicillin 875 mg p.o. twice daily for 10 days.  If the patient develops any cough or shortness of breath she will notify us immediately.

## 2018-10-08 ENCOUNTER — Other Ambulatory Visit: Payer: Self-pay | Admitting: Family Medicine

## 2018-10-08 DIAGNOSIS — M549 Dorsalgia, unspecified: Secondary | ICD-10-CM

## 2018-10-18 ENCOUNTER — Other Ambulatory Visit: Payer: Self-pay

## 2018-10-18 ENCOUNTER — Encounter: Payer: Self-pay | Admitting: Family Medicine

## 2018-10-18 ENCOUNTER — Ambulatory Visit (INDEPENDENT_AMBULATORY_CARE_PROVIDER_SITE_OTHER): Payer: BC Managed Care – PPO | Admitting: Family Medicine

## 2018-10-18 VITALS — BP 130/72 | HR 100 | Temp 98.5°F | Resp 18 | Ht 61.0 in | Wt 202.4 lb

## 2018-10-18 DIAGNOSIS — G5682 Other specified mononeuropathies of left upper limb: Secondary | ICD-10-CM

## 2018-10-18 DIAGNOSIS — M542 Cervicalgia: Secondary | ICD-10-CM | POA: Diagnosis not present

## 2018-10-18 MED ORDER — METHYLPREDNISOLONE 4 MG PO TBPK: ORAL_TABLET | ORAL | 0 refills | Status: DC

## 2018-10-18 MED ORDER — CYCLOBENZAPRINE HCL 10 MG PO TABS: 10.0000 mg | ORAL_TABLET | Freq: Three times a day (TID) | ORAL | 2 refills | Status: DC | PRN

## 2018-10-18 NOTE — Patient Instructions (Addendum)
Continue heat and ice Take steroids  Use flexeril twice a day  If not improving over next 2 weeks call and imaging will be done

## 2018-10-18 NOTE — Progress Notes (Signed)
   Subjective:    Patient ID: [name], female    DOB: January 11, 1966, 53 y.o.   MRN: [id number]  Patient presents for Neck Pain (started x1 week, flexeril and advil was taken)  Friday wore up with neck pain mostly on her left side that progressed through weekend.  She had pain when she moved her neck side to side, no nubness or tingling, but has radiating pain from neck into left shoulder joint and blade.  It is now mostly centered in her posterior shoulder especially if she raises her arm up above shoulder level she feels a sharp pain.  Taking advil and flexeril  Pain has actually improved from this weekend but it is still lingering so she came in for a visit.   Flexeril usually taken at night taken with ambien due to chronic insomnia   History of ruptured disc in neck with discketcomy > 10 years ago    Review Of Systems:  GEN- denies fatigue, fever, weight loss,weakness, recent illness HEENT- denies eye drainage, change in vision, nasal discharge, CVS- denies chest pain, palpitations RESP- denies SOB, cough, wheeze ABD- denies N/V, change in stools, abd pain GU- denies dysuria, hematuria, dribbling, incontinence MSK- +joint pain, muscle aches, injury Neuro- denies headache, dizziness, syncope, seizure activity       Objective:    BP 130/72   Pulse 100   Temp 98.5 F (36.9 C)   Resp 18   Ht 5\' 1"  (1.549 m)   Wt 202 lb 6.4 oz (91.8 kg)   SpO2 97%   BMI 38.24 kg/m  GEN- NAD, alert and oriented x3 HEENT- PERRL, EOMI, non injected sclera, pink conjunctiva, MMM, oropharynx clear Neck- Supple, C-spine nontender mild tender to palpation along sternocleidomastoid area as well as trapezius.  Negative Spurling's CVS- RRR, no murmur RESP-CTAB MSK- TTP along left shoulder blade, rotator cuff in tact, biceps in tact, pain with elevation arm above shoulder height Neuro-sensation in tact UE, strength equal bilat UE  EXT- No edema Pulses- Radial 2+        Assessment &  Plan:      Problem List Items Addressed This Visit    None    Visit Diagnoses    Pinched nerve in shoulder, left    -  Primary   Pain more consistent with pinched nerve in shoulder, some MSK pain in neck, already improved, given medrol dosepak, D/C NSAID Given flexeril refill Hold on imaging at this time   Relevant Medications   methylPREDNISolone (MEDROL DOSEPAK) 4 MG TBPK tablet   cyclobenzaprine (FLEXERIL) 10 MG tablet   Neck pain          Note: This dictation was prepared with Dragon dictation along with smaller phrase technology. Any transcriptional errors that result from this process are unintentional.

## 2018-11-04 ENCOUNTER — Other Ambulatory Visit: Payer: Self-pay | Admitting: Family Medicine

## 2018-11-04 DIAGNOSIS — I10 Essential (primary) hypertension: Secondary | ICD-10-CM

## 2018-11-05 ENCOUNTER — Other Ambulatory Visit: Payer: Self-pay | Admitting: Family Medicine

## 2018-11-05 DIAGNOSIS — E559 Vitamin D deficiency, unspecified: Secondary | ICD-10-CM

## 2018-11-06 NOTE — Telephone Encounter (Signed)
Requested Prescriptions   Pending Prescriptions Disp Refills  . zolpidem (AMBIEN) 10 MG tablet [Pharmacy Med Name: ZOLPIDEM 10MG  TABLETS] 30 tablet     Sig: TAKE 1 TABLET(10 MG) BY MOUTH AT BEDTIME   Last OV 10/24/2018 Last written 08/09/2018

## 2018-12-03 ENCOUNTER — Other Ambulatory Visit: Payer: Self-pay | Admitting: Family Medicine

## 2018-12-03 DIAGNOSIS — E559 Vitamin D deficiency, unspecified: Secondary | ICD-10-CM

## 2018-12-04 ENCOUNTER — Other Ambulatory Visit: Payer: Self-pay | Admitting: Family Medicine

## 2018-12-04 DIAGNOSIS — E559 Vitamin D deficiency, unspecified: Secondary | ICD-10-CM

## 2018-12-04 NOTE — Telephone Encounter (Signed)
Requesting refill    Ambien  LOV: 09/16/18  LRF:  11/07/18

## 2019-01-01 ENCOUNTER — Other Ambulatory Visit: Payer: Self-pay | Admitting: Family Medicine

## 2019-01-01 DIAGNOSIS — E559 Vitamin D deficiency, unspecified: Secondary | ICD-10-CM

## 2019-01-02 ENCOUNTER — Other Ambulatory Visit: Payer: Self-pay | Admitting: Family Medicine

## 2019-01-02 DIAGNOSIS — G5682 Other specified mononeuropathies of left upper limb: Secondary | ICD-10-CM

## 2019-01-02 NOTE — Telephone Encounter (Signed)
Ok to refill??  Last office visit 10/18/2018.  Last refill 12/05/2018.

## 2019-01-02 NOTE — Telephone Encounter (Signed)
Ok to refill 

## 2019-02-04 ENCOUNTER — Other Ambulatory Visit: Payer: Self-pay | Admitting: Family Medicine

## 2019-02-04 DIAGNOSIS — E559 Vitamin D deficiency, unspecified: Secondary | ICD-10-CM

## 2019-02-05 NOTE — Telephone Encounter (Signed)
Ok to refill??  Last office visit 10/18/2018.  Last refill 01/02/2019.  Ok to add refills?

## 2019-04-15 ENCOUNTER — Other Ambulatory Visit: Payer: Self-pay | Admitting: Family Medicine

## 2019-04-15 DIAGNOSIS — I10 Essential (primary) hypertension: Secondary | ICD-10-CM

## 2019-04-15 DIAGNOSIS — G5682 Other specified mononeuropathies of left upper limb: Secondary | ICD-10-CM

## 2019-04-15 MED ORDER — CYCLOBENZAPRINE HCL 10 MG PO TABS: 10.0000 mg | ORAL_TABLET | Freq: Three times a day (TID) | ORAL | 2 refills | Status: DC | PRN

## 2019-04-15 MED ORDER — LOSARTAN POTASSIUM-HCTZ 100-12.5 MG PO TABS: 1.0000 | ORAL_TABLET | Freq: Every day | ORAL | 1 refills | Status: DC

## 2019-04-17 ENCOUNTER — Other Ambulatory Visit: Payer: Self-pay | Admitting: Family Medicine

## 2019-04-17 MED ORDER — PRAVASTATIN SODIUM 40 MG PO TABS: 40.0000 mg | ORAL_TABLET | Freq: Every day | ORAL | 0 refills | Status: DC

## 2019-06-04 ENCOUNTER — Other Ambulatory Visit: Payer: Self-pay | Admitting: Family Medicine

## 2019-06-04 DIAGNOSIS — E559 Vitamin D deficiency, unspecified: Secondary | ICD-10-CM

## 2019-06-04 NOTE — Telephone Encounter (Signed)
Ok to refill??  Last office visit 10/18/2018.  Last refill 02/06/2019, #3 refills.

## 2019-06-06 ENCOUNTER — Other Ambulatory Visit: Payer: Self-pay

## 2019-06-06 ENCOUNTER — Ambulatory Visit (INDEPENDENT_AMBULATORY_CARE_PROVIDER_SITE_OTHER): Payer: BC Managed Care – PPO | Admitting: Family Medicine

## 2019-06-06 VITALS — BP 128/74 | HR 98 | Temp 97.1°F | Resp 18 | Ht 61.0 in | Wt 198.0 lb

## 2019-06-06 DIAGNOSIS — E785 Hyperlipidemia, unspecified: Secondary | ICD-10-CM | POA: Diagnosis not present

## 2019-06-06 DIAGNOSIS — I10 Essential (primary) hypertension: Secondary | ICD-10-CM | POA: Diagnosis not present

## 2019-06-06 DIAGNOSIS — Z23 Encounter for immunization: Secondary | ICD-10-CM

## 2019-06-06 NOTE — Progress Notes (Signed)
Subjective:    Patient ID: [name], female    DOB: 1965/06/03, 54 y.o.   MRN: [id number]  Patient is a very pleasant 54 year old Caucasian female with a history of right breast cancer, renal cell carcinoma in her left kidney who presents today for a follow-up of her hypertension.  Blood pressure today is well controlled at 128/74.  She is overdue for colon cancer screening however never had a colonoscopy.  Her last Pap smear was in 2016 and is also well overdue.  We discussed this at length today.  She declines a Pap smear.  She does not want to go for a colonoscopy but she will consent to Cologuard testing.  She is also due for a flu shot.  She declines an HIV test.  She denies any chest pain shortness of breath or dyspnea on exertion.  She denies any myalgias right upper quadrant pain.  She denies any orthopnea or paroxysmal nocturnal dyspnea. Past Medical History:  Diagnosis Date  . Breast cancer ([place]) 12/2005   Right breast  . Cancer ([place]) 02/2007   Renal cell carcinoma - Left kidney  . Fibromyalgia   . Hypertension   . Insomnia   . Ruptured disc, cervical    Past Surgical History:  Procedure Laterality Date  . CERVICAL DISC SURGERY    . CESAREAN SECTION     C-section x 3  . KNEE ARTHROSCOPY Right 1998  . MASTECTOMY Right 02/02/2006  . PARTIAL NEPHRECTOMY Left 06/11/2006  . PORT-A-CATH REMOVAL    . SALPINGOOPHORECTOMY     Current Outpatient Medications on File Prior to Visit  Medication Sig Dispense Refill  . cyclobenzaprine (FLEXERIL) 10 MG tablet Take 1 tablet (10 mg total) by mouth 3 (three) times daily as needed. for muscle spams 45 tablet 2  . losartan-hydrochlorothiazide (HYZAAR) 100-12.5 MG tablet Take 1 tablet by mouth daily. 90 tablet 1  . methylPREDNISolone (MEDROL DOSEPAK) 4 MG TBPK tablet Take as directed on package 21 tablet 0  . potassium chloride SA (K-DUR,KLOR-CON) 20 MEQ tablet Take 1 tablet (20 mEq total) by mouth daily. 30 tablet 5  . pravastatin  (PRAVACHOL) 40 MG tablet Take 1 tablet (40 mg total) by mouth daily. Needs office visit and labs before further refills 90 tablet 0  . zolpidem (AMBIEN) 10 MG tablet TAKE 1 TABLET(10 MG) BY MOUTH AT BEDTIME 30 tablet 3   No current facility-administered medications on file prior to visit.   Allergies  Allergen Reactions  . Codeine Nausea And Vomiting   Social History   Socioeconomic History  . Marital status: Married    Spouse name: Not on file  . Number of children: Not on file  . Years of education: Not on file  . Highest education level: Not on file  Occupational History  . Not on file  Tobacco Use  . Smoking status: Former Smoker    Types: Cigarettes  . Smokeless tobacco: Never Used  . Tobacco comment: Quit over 20 years ago  Substance and Sexual Activity  . Alcohol use: No  . Drug use: No  . Sexual activity: Yes    Birth control/protection: Surgical  Other Topics Concern  . Not on file  Social History Narrative  . Not on file   Social Determinants of Health   Financial Resource Strain:   . Difficulty of Paying Living Expenses: Not on file  Food Insecurity:   . Worried About Charity fundraiser in the Last Year: Not on file  .  Ran Out of Food in the Last Year: Not on file  Transportation Needs:   . Lack of Transportation (Medical): Not on file  . Lack of Transportation (Non-Medical): Not on file  Physical Activity:   . Days of Exercise per Week: Not on file  . Minutes of Exercise per Session: Not on file  Stress:   . Feeling of Stress : Not on file  Social Connections:   . Frequency of Communication with Friends and Family: Not on file  . Frequency of Social Gatherings with Friends and Family: Not on file  . Attends Religious Services: Not on file  . Active Member of Clubs or Organizations: Not on file  . Attends Archivist Meetings: Not on file  . Marital Status: Not on file  Intimate Partner Violence:   . Fear of Current or Ex-Partner: Not on  file  . Emotionally Abused: Not on file  . Physically Abused: Not on file  . Sexually Abused: Not on file     Review of Systems  All other systems reviewed and are negative.      Objective:   Physical Exam  Constitutional: She appears well-developed and well-nourished.  HENT:  Right Ear: External ear normal.  Left Ear: External ear normal.  Nose: Nose normal.  Mouth/Throat: Oropharynx is clear and moist. No oropharyngeal exudate.  Cardiovascular: Normal rate, regular rhythm and normal heart sounds.  Pulmonary/Chest: Effort normal and breath sounds normal. No respiratory distress. She has no wheezes. She has no rales.  Abdominal: Soft. Bowel sounds are normal.  Musculoskeletal:     Cervical back: Neck supple.  Lymphadenopathy:    She has no cervical adenopathy.  Vitals reviewed.         Assessment & Plan:  Benign essential HTN - Plan: CBC with Differential/Platelet, COMPLETE METABOLIC PANEL WITH GFR, Lipid panel  Hyperlipidemia, unspecified hyperlipidemia type - Plan: CBC with Differential/Platelet, COMPLETE METABOLIC PANEL WITH GFR, Lipid panel  Needs flu shot - Plan: Flu Vaccine QUAD 36+ mos IM  Patient denies any side effects from pravastatin.  I will check a fasting lipid panel.  Ideally I like her LDL cholesterol to be below 100.  Also monitor her liver function test.  Her blood pressure today is well controlled.  I will make no changes in her antihypertensives but I will monitor her potassium and renal function given the fact she is on a diuretic.  Patient received her flu shot today.  I will schedule the patient for colon cancer screening with a colonoscopy.  She refuses a Pap smear today.

## 2019-06-07 LAB — COMPLETE METABOLIC PANEL WITH GFR
AG Ratio: 1.3 (calc) (ref 1.0–2.5)
ALT: 43 U/L — ABNORMAL HIGH (ref 6–29)
AST: 45 U/L — ABNORMAL HIGH (ref 10–35)
Albumin: 4.1 g/dL (ref 3.6–5.1)
Alkaline phosphatase (APISO): 97 U/L (ref 37–153)
BUN: 12 mg/dL (ref 7–25)
CO2: 25 mmol/L (ref 20–32)
Calcium: 9.4 mg/dL (ref 8.6–10.4)
Chloride: 103 mmol/L (ref 98–110)
Creat: 0.85 mg/dL (ref 0.50–1.05)
GFR, Est African American: 90 mL/min/{1.73_m2} (ref >=60)
GFR, Est Non African American: 78 mL/min/{1.73_m2} (ref >=60)
Globulin: 3.2 g/dL (calc) (ref 1.9–3.7)
Glucose, Bld: 107 mg/dL — ABNORMAL HIGH (ref 65–99)
Potassium: 3.8 mmol/L (ref 3.5–5.3)
Sodium: 139 mmol/L (ref 135–146)
Total Bilirubin: 0.7 mg/dL (ref 0.2–1.2)
Total Protein: 7.3 g/dL (ref 6.1–8.1)

## 2019-06-07 LAB — CBC WITH DIFFERENTIAL/PLATELET
Absolute Monocytes: 791 cells/uL (ref 200–950)
Basophils Absolute: 102 cells/uL (ref 0–200)
Basophils Relative: 1.1 %
Eosinophils Absolute: 223 cells/uL (ref 15–500)
Eosinophils Relative: 2.4 %
HCT: 41.2 % (ref 35.0–45.0)
Hemoglobin: 14.2 g/dL (ref 11.7–15.5)
Lymphs Abs: 3246 cells/uL (ref 850–3900)
MCH: 30.7 pg (ref 27.0–33.0)
MCHC: 34.5 g/dL (ref 32.0–36.0)
MCV: 89.2 fL (ref 80.0–100.0)
MPV: 10.7 fL (ref 7.5–12.5)
Monocytes Relative: 8.5 %
Neutro Abs: 4938 cells/uL (ref 1500–7800)
Neutrophils Relative %: 53.1 %
Platelets: 325 10*3/uL (ref 140–400)
RBC: 4.62 10*6/uL (ref 3.80–5.10)
RDW: 13.3 % (ref 11.0–15.0)
Total Lymphocyte: 34.9 %
WBC: 9.3 10*3/uL (ref 3.8–10.8)

## 2019-06-07 LAB — LIPID PANEL
Cholesterol: 157 mg/dL (ref <200)
HDL: 48 mg/dL — ABNORMAL LOW (ref >=50)
LDL Cholesterol (Calc): 88 mg/dL (calc)
Non-HDL Cholesterol (Calc): 109 mg/dL (calc) (ref <130)
Total CHOL/HDL Ratio: 3.3 (calc) (ref <5.0)
Triglycerides: 114 mg/dL (ref <150)

## 2019-06-12 ENCOUNTER — Telehealth: Payer: Self-pay | Admitting: *Deleted

## 2019-06-12 NOTE — Telephone Encounter (Signed)
Received verbal orders for Cologuard.   Order placed via [facility].   Cologuard (Order HH:[id number])

## 2019-06-12 NOTE — Telephone Encounter (Signed)
-----   Message from Alyson Locket, [place] sent at 06/06/2019  8:25 AM EST -----  ----- Message ----- From: Susy Frizzle, MD Sent: 06/06/2019   8:08 AM EST To: Alyson Locket, RMA  Please schedule cologuard

## 2019-07-13 ENCOUNTER — Other Ambulatory Visit: Payer: Self-pay | Admitting: Family Medicine

## 2019-07-14 ENCOUNTER — Other Ambulatory Visit: Payer: Self-pay | Admitting: Family Medicine

## 2019-07-14 MED ORDER — PRAVASTATIN SODIUM 40 MG PO TABS: 40.0000 mg | ORAL_TABLET | Freq: Every day | ORAL | 0 refills | Status: DC

## 2019-10-06 ENCOUNTER — Other Ambulatory Visit: Payer: Self-pay | Admitting: Family Medicine

## 2019-10-06 DIAGNOSIS — E559 Vitamin D deficiency, unspecified: Secondary | ICD-10-CM

## 2019-10-06 DIAGNOSIS — G5682 Other specified mononeuropathies of left upper limb: Secondary | ICD-10-CM

## 2019-10-07 NOTE — Telephone Encounter (Signed)
Ok to refill??  Last office visit 06/06/2019   Last refill on  Ambien 06/05/2019, #3 refills.  Last refill on Flexeril 04/15/2019, #2 refills.

## 2019-10-10 ENCOUNTER — Other Ambulatory Visit: Payer: Self-pay | Admitting: Family Medicine

## 2019-10-10 DIAGNOSIS — I10 Essential (primary) hypertension: Secondary | ICD-10-CM

## 2020-01-02 ENCOUNTER — Other Ambulatory Visit: Payer: Self-pay | Admitting: Family Medicine

## 2020-01-27 ENCOUNTER — Telehealth (INDEPENDENT_AMBULATORY_CARE_PROVIDER_SITE_OTHER): Payer: BC Managed Care – PPO | Admitting: Family Medicine

## 2020-01-27 ENCOUNTER — Other Ambulatory Visit: Payer: Self-pay

## 2020-01-27 DIAGNOSIS — R509 Fever, unspecified: Secondary | ICD-10-CM

## 2020-01-27 DIAGNOSIS — J029 Acute pharyngitis, unspecified: Secondary | ICD-10-CM | POA: Diagnosis not present

## 2020-01-27 MED ORDER — AMOXICILLIN 875 MG PO TABS: 875.0000 mg | ORAL_TABLET | Freq: Two times a day (BID) | ORAL | 0 refills | Status: DC

## 2020-01-27 NOTE — Addendum Note (Signed)
Addended by: Jenna Luo T on: 01/27/2020 09:42 AM   Modules accepted: Orders

## 2020-01-27 NOTE — Progress Notes (Signed)
Subjective:    Patient ID: [name], female    DOB: 11/04/1965, 54 y.o.   MRN: [id number]  HPI Patient is being seen today as a telephone visit.  She consents to be seen via telephone.  She is currently at home.  I am currently in my office.  Phone call began at 810.  Phone call concluded at 821.  Patient states that her symptoms began early this morning around 330.  Symptoms included a low-grade fever greater than 100.  She also has a sore throat and pain with swallowing.  In the back of her throat she says it is red with white spots.  She denies any cough.  She denies any runny nose.  She denies any shortness of breath or chest pain.  She denies any nausea or vomiting.  On Sunday, her granddaughter had a low-grade fever and was experiencing vomiting however she is better now.  The patient has not received her Covid vaccine.  She does have a past medical history of frequent strep infections. Past Medical History:  Diagnosis Date  . Breast cancer ([place]) 12/2005   Right breast  . Cancer ([place]) 02/2007   Renal cell carcinoma - Left kidney  . Fibromyalgia   . Hypertension   . Insomnia   . Ruptured disc, cervical    Past Surgical History:  Procedure Laterality Date  . CERVICAL DISC SURGERY    . CESAREAN SECTION     C-section x 3  . KNEE ARTHROSCOPY Right 1998  . MASTECTOMY Right 02/02/2006  . PARTIAL NEPHRECTOMY Left 06/11/2006  . PORT-A-CATH REMOVAL    . SALPINGOOPHORECTOMY     Current Outpatient Medications on File Prior to Visit  Medication Sig Dispense Refill  . cyclobenzaprine (FLEXERIL) 10 MG tablet TAKE 1 TABLET BY MOUTH THREE TIMES DAILY AS NEEDED FOR MUSCLE SPASMS 30 tablet 2  . losartan-hydrochlorothiazide (HYZAAR) 100-12.5 MG tablet TAKE 1 TABLET BY MOUTH DAILY 90 tablet 1  . potassium chloride SA (K-DUR,KLOR-CON) 20 MEQ tablet Take 1 tablet (20 mEq total) by mouth daily. 30 tablet 5  . pravastatin (PRAVACHOL) 40 MG tablet TAKE 1 TABLET(40 MG) BY MOUTH DAILY 90 tablet 0    . pravastatin (PRAVACHOL) 40 MG tablet TAKE 1 TABLET(40 MG) BY MOUTH DAILY 90 tablet 1  . zolpidem (AMBIEN) 10 MG tablet TAKE 1 TABLET(10 MG) BY MOUTH AT BEDTIME 30 tablet 3   No current facility-administered medications on file prior to visit.   Allergies  Allergen Reactions  . Codeine Nausea And Vomiting   Social History   Socioeconomic History  . Marital status: Married    Spouse name: Not on file  . Number of children: Not on file  . Years of education: Not on file  . Highest education level: Not on file  Occupational History  . Not on file  Tobacco Use  . Smoking status: Former Smoker    Types: Cigarettes  . Smokeless tobacco: Never Used  . Tobacco comment: Quit over 20 years ago  Substance and Sexual Activity  . Alcohol use: No  . Drug use: No  . Sexual activity: Yes    Birth control/protection: Surgical  Other Topics Concern  . Not on file  Social History Narrative  . Not on file   Social Determinants of Health   Financial Resource Strain:   . Difficulty of Paying Living Expenses: Not on file  Food Insecurity:   . Worried About Charity fundraiser in the Last Year: Not on  file  . [place] in the Last Year: Not on file  Transportation Needs:   . Lack of Transportation (Medical): Not on file  . Lack of Transportation (Non-Medical): Not on file  Physical Activity:   . Days of Exercise per Week: Not on file  . Minutes of Exercise per Session: Not on file  Stress:   . Feeling of Stress : Not on file  Social Connections:   . Frequency of Communication with Friends and Family: Not on file  . Frequency of Social Gatherings with Friends and Family: Not on file  . Attends Religious Services: Not on file  . Active Member of Clubs or Organizations: Not on file  . Attends Archivist Meetings: Not on file  . Marital Status: Not on file  Intimate Partner Violence:   . Fear of Current or Ex-Partner: Not on file  . Emotionally Abused: Not on file  .  Physically Abused: Not on file  . Sexually Abused: Not on file      Review of Systems  All other systems reviewed and are negative.      Objective:   Physical Exam        Assessment & Plan:  Fever, unspecified fever cause - Plan: SARS-COV-2 RNA,(COVID-19) QUAL NAAT, STREP GROUP A AG, W/REFLEX TO CULT  Sore throat - Plan: SARS-COV-2 RNA,(COVID-19) QUAL NAAT, STREP GROUP A AG, W/REFLEX TO CULT  Given the patient's complicated medical history and history of cancer I wonder rule out COVID-19 as the patient would definitely benefit from Regeneron therapy.  Therefore of asked her to come to the office so that I can perform a Covid PCR test.  I will also screen the patient for strep throat.  Await the results of strep test and Covid test however I suspect a viral infection based on the presentation and exposure.

## 2020-01-29 LAB — CULTURE, GROUP A STREP
MICRO NUMBER:: 10947579
SPECIMEN QUALITY:: ADEQUATE

## 2020-01-29 LAB — STREP GROUP A AG, W/REFLEX TO CULT: Streptococcus, Group A Screen (Direct): NOT DETECTED

## 2020-01-29 LAB — SARS-COV-2 RNA,(COVID-19) QUALITATIVE NAAT: SARS CoV2 RNA: DETECTED — CR

## 2020-01-31 ENCOUNTER — Other Ambulatory Visit: Payer: Self-pay | Admitting: Physician Assistant

## 2020-01-31 DIAGNOSIS — Z853 Personal history of malignant neoplasm of breast: Secondary | ICD-10-CM

## 2020-01-31 DIAGNOSIS — U071 COVID-19: Secondary | ICD-10-CM

## 2020-01-31 DIAGNOSIS — I10 Essential (primary) hypertension: Secondary | ICD-10-CM

## 2020-01-31 NOTE — Progress Notes (Signed)
I connected by phone with [name] on 01/31/2020 at 2:22 PM to discuss the potential use of a new treatment for mild to moderate COVID-19 viral infection in non-hospitalized patients.  This patient is a 54 y.o. female that meets the FDA criteria for Emergency Use Authorization of COVID monoclonal antibody casirivimab/imdevimab.  Has a (+) direct SARS-CoV-2 viral test result  Has mild or moderate COVID-19   Is NOT hospitalized due to COVID-19  Is within 10 days of symptom onset  Has at least one of the high risk factor(s) for progression to severe COVID-19 and/or hospitalization as defined in EUA.  Specific high risk criteria : BMI > 25, Immunosuppressive Disease or Treatment and Cardiovascular disease or hypertension   I have spoken and communicated the following to the patient or parent/caregiver regarding COVID monoclonal antibody treatment:  1. FDA has authorized the emergency use for the treatment of mild to moderate COVID-19 in adults and pediatric patients with positive results of direct SARS-CoV-2 viral testing who are 68 years of age and older weighing at least 40 kg, and who are at high risk for progressing to severe COVID-19 and/or hospitalization.  2. The significant known and potential risks and benefits of COVID monoclonal antibody, and the extent to which such potential risks and benefits are unknown.  3. Information on available alternative treatments and the risks and benefits of those alternatives, including clinical trials.  4. Patients treated with COVID monoclonal antibody should continue to self-isolate and use infection control measures (e.g., wear mask, isolate, social distance, avoid sharing personal items, clean and disinfect "high touch" surfaces, and frequent handwashing) according to CDC guidelines.   5. The patient or parent/caregiver has the option to accept or refuse COVID monoclonal antibody treatment.  After reviewing this information with the  patient, The patient agreed to proceed with receiving casirivimab\imdevimab infusion and will be provided a copy of the Fact sheet prior to receiving the infusion.   Dara Camargo 01/31/2020 2:22 PM

## 2020-02-01 ENCOUNTER — Ambulatory Visit (HOSPITAL_COMMUNITY)
Admission: RE | Admit: 2020-02-01 | Discharge: 2020-02-01 | Disposition: A | Discharge status: Home / Self Care | Payer: BC Managed Care – PPO | Source: Ambulatory Visit | Attending: Pulmonary Disease | Admitting: Pulmonary Disease

## 2020-02-01 DIAGNOSIS — U071 COVID-19: Secondary | ICD-10-CM

## 2020-02-01 MED ORDER — FAMOTIDINE IN NACL 20-0.9 MG/50ML-% IV SOLN: 20.0000 mg | Freq: Once | INTRAVENOUS | Status: DC | PRN

## 2020-02-01 MED ORDER — EPINEPHRINE 0.3 MG/0.3ML IJ SOAJ: 0.3000 mg | Freq: Once | INTRAMUSCULAR | Status: DC | PRN

## 2020-02-01 MED ORDER — SODIUM CHLORIDE 0.9 % IV SOLN
1200.0000 mg | Freq: Once | INTRAVENOUS | Status: AC
  Administered 2020-02-01: 1200 mg via INTRAVENOUS

## 2020-02-01 MED ORDER — DIPHENHYDRAMINE HCL 50 MG/ML IJ SOLN: 50.0000 mg | Freq: Once | INTRAMUSCULAR | Status: DC | PRN

## 2020-02-01 MED ORDER — ACETAMINOPHEN 325 MG PO TABS
650.0000 mg | ORAL_TABLET | Freq: Once | ORAL | Status: AC
  Administered 2020-02-01: 650 mg via ORAL
  Filled 2020-02-01: qty 2

## 2020-02-01 MED ORDER — SODIUM CHLORIDE 0.9 % IV SOLN: INTRAVENOUS | Status: DC | PRN

## 2020-02-01 MED ORDER — METHYLPREDNISOLONE SODIUM SUCC 125 MG IJ SOLR: 125.0000 mg | Freq: Once | INTRAMUSCULAR | Status: DC | PRN

## 2020-02-01 MED ORDER — ALBUTEROL SULFATE HFA 108 (90 BASE) MCG/ACT IN AERS: 2.0000 | INHALATION_SPRAY | Freq: Once | RESPIRATORY_TRACT | Status: DC | PRN

## 2020-02-01 NOTE — Discharge Instructions (Signed)

## 2020-02-01 NOTE — Progress Notes (Signed)
  Diagnosis: COVID-19  Physician:DR. Joya Gaskins  Procedure: [facility] Med: casirivimab\imdevimab infusion - Provided patient with casirivimab\imdevimab fact sheet for patients, parents and caregivers prior to infusion.  Complications: No immediate complications noted.  Discharge: Discharged home   Mickey Esguerra, Cathlyn Parsons 02/01/2020

## 2020-02-03 ENCOUNTER — Other Ambulatory Visit: Payer: Self-pay | Admitting: Family Medicine

## 2020-02-03 DIAGNOSIS — E559 Vitamin D deficiency, unspecified: Secondary | ICD-10-CM

## 2020-02-04 NOTE — Telephone Encounter (Signed)
Ok to refill??  Last office visit 01/27/2020.  Last refill 10/07/2019.

## 2020-03-10 ENCOUNTER — Other Ambulatory Visit: Payer: Self-pay | Admitting: Family Medicine

## 2020-03-10 DIAGNOSIS — G5682 Other specified mononeuropathies of left upper limb: Secondary | ICD-10-CM

## 2020-04-06 ENCOUNTER — Other Ambulatory Visit: Payer: Self-pay | Admitting: Family Medicine

## 2020-04-06 DIAGNOSIS — I10 Essential (primary) hypertension: Secondary | ICD-10-CM

## 2020-04-07 ENCOUNTER — Other Ambulatory Visit: Payer: Self-pay | Admitting: Family Medicine

## 2020-04-07 DIAGNOSIS — I10 Essential (primary) hypertension: Secondary | ICD-10-CM

## 2020-04-23 ENCOUNTER — Other Ambulatory Visit: Payer: Self-pay

## 2020-04-23 ENCOUNTER — Ambulatory Visit (INDEPENDENT_AMBULATORY_CARE_PROVIDER_SITE_OTHER): Payer: BC Managed Care – PPO | Admitting: Family Medicine

## 2020-04-23 VITALS — BP 130/80 | HR 98 | Temp 98.0°F | Ht 61.0 in | Wt 190.0 lb

## 2020-04-23 DIAGNOSIS — Z1231 Encounter for screening mammogram for malignant neoplasm of breast: Secondary | ICD-10-CM | POA: Diagnosis not present

## 2020-04-23 DIAGNOSIS — E785 Hyperlipidemia, unspecified: Secondary | ICD-10-CM | POA: Diagnosis not present

## 2020-04-23 DIAGNOSIS — Z23 Encounter for immunization: Secondary | ICD-10-CM | POA: Diagnosis not present

## 2020-04-23 DIAGNOSIS — I10 Essential (primary) hypertension: Secondary | ICD-10-CM

## 2020-04-23 DIAGNOSIS — Z1211 Encounter for screening for malignant neoplasm of colon: Secondary | ICD-10-CM

## 2020-04-23 LAB — COMPLETE METABOLIC PANEL WITH GFR
AG Ratio: 1.3 (calc) (ref 1.0–2.5)
ALT: 24 U/L (ref 6–29)
AST: 26 U/L (ref 10–35)
Albumin: 4 g/dL (ref 3.6–5.1)
Alkaline phosphatase (APISO): 97 U/L (ref 37–153)
BUN: 11 mg/dL (ref 7–25)
CO2: 27 mmol/L (ref 20–32)
Calcium: 9.7 mg/dL (ref 8.6–10.4)
Chloride: 103 mmol/L (ref 98–110)
Creat: 0.77 mg/dL (ref 0.50–1.05)
GFR, Est African American: 101 mL/min/{1.73_m2} (ref >=60)
GFR, Est Non African American: 87 mL/min/{1.73_m2} (ref >=60)
Globulin: 3.1 g/dL (calc) (ref 1.9–3.7)
Glucose, Bld: 92 mg/dL (ref 65–99)
Potassium: 3.4 mmol/L — ABNORMAL LOW (ref 3.5–5.3)
Sodium: 140 mmol/L (ref 135–146)
Total Bilirubin: 0.8 mg/dL (ref 0.2–1.2)
Total Protein: 7.1 g/dL (ref 6.1–8.1)

## 2020-04-23 LAB — CBC WITH DIFFERENTIAL/PLATELET
Absolute Monocytes: 853 cells/uL (ref 200–950)
Basophils Absolute: 94 cells/uL (ref 0–200)
Basophils Relative: 0.9 %
Eosinophils Absolute: 229 cells/uL (ref 15–500)
Eosinophils Relative: 2.2 %
HCT: 40.5 % (ref 35.0–45.0)
Hemoglobin: 13.7 g/dL (ref 11.7–15.5)
Lymphs Abs: 3037 cells/uL (ref 850–3900)
MCH: 30.7 pg (ref 27.0–33.0)
MCHC: 33.8 g/dL (ref 32.0–36.0)
MCV: 90.8 fL (ref 80.0–100.0)
MPV: 10.6 fL (ref 7.5–12.5)
Monocytes Relative: 8.2 %
Neutro Abs: 6188 cells/uL (ref 1500–7800)
Neutrophils Relative %: 59.5 %
Platelets: 317 10*3/uL (ref 140–400)
RBC: 4.46 10*6/uL (ref 3.80–5.10)
RDW: 13 % (ref 11.0–15.0)
Total Lymphocyte: 29.2 %
WBC: 10.4 10*3/uL (ref 3.8–10.8)

## 2020-04-23 LAB — LIPID PANEL
Cholesterol: 140 mg/dL (ref <200)
HDL: 47 mg/dL — ABNORMAL LOW (ref >=50)
LDL Cholesterol (Calc): 73 mg/dL (calc)
Non-HDL Cholesterol (Calc): 93 mg/dL (calc) (ref <130)
Total CHOL/HDL Ratio: 3 (calc) (ref <5.0)
Triglycerides: 112 mg/dL (ref <150)

## 2020-04-23 NOTE — Progress Notes (Signed)
Subjective:    Patient ID: [name], female    DOB: April 16, 1966, 54 y.o.   MRN: [id number]  Patient is a very pleasant 54 year old Caucasian female here today for a follow-up.  She has not yet had her colonoscopy.  She was unable to afford the Cologuard.  She is also not had a mammogram.  She is willing to allow me now to schedule her for a colonoscopy as well as her mammogram so I put those orders in today.  Her blood pressure today is outstanding at 130/80.  She denies any chest pain shortness of breath or dyspnea on exertion.  She is already scheduled to get her Covid shot later in December.  She would like a flu shot today. Past Medical History:  Diagnosis Date  . Breast cancer ([place]) 12/2005   Right breast  . Cancer ([place]) 02/2007   Renal cell carcinoma - Left kidney  . Fibromyalgia   . Hypertension   . Insomnia   . Ruptured disc, cervical    Past Surgical History:  Procedure Laterality Date  . CERVICAL DISC SURGERY    . CESAREAN SECTION     C-section x 3  . KNEE ARTHROSCOPY Right 1998  . MASTECTOMY Right 02/02/2006  . PARTIAL NEPHRECTOMY Left 06/11/2006  . PORT-A-CATH REMOVAL    . SALPINGOOPHORECTOMY     Current Outpatient Medications on File Prior to Visit  Medication Sig Dispense Refill  . amoxicillin (AMOXIL) 875 MG tablet Take 1 tablet (875 mg total) by mouth 2 (two) times daily. 20 tablet 0  . cyclobenzaprine (FLEXERIL) 10 MG tablet TAKE 1 TABLET BY MOUTH THREE TIMES DAILY AS NEEDED FOR MUSCLE SPASMS 30 tablet 2  . losartan-hydrochlorothiazide (HYZAAR) 100-12.5 MG tablet TAKE 1 TABLET BY MOUTH DAILY 30 tablet 0  . potassium chloride SA (K-DUR,KLOR-CON) 20 MEQ tablet Take 1 tablet (20 mEq total) by mouth daily. 30 tablet 5  . pravastatin (PRAVACHOL) 40 MG tablet TAKE 1 TABLET(40 MG) BY MOUTH DAILY 90 tablet 0  . pravastatin (PRAVACHOL) 40 MG tablet TAKE 1 TABLET(40 MG) BY MOUTH DAILY 90 tablet 1  . zolpidem (AMBIEN) 10 MG tablet TAKE 1 TABLET(10 MG) BY MOUTH AT  BEDTIME 30 tablet 3   No current facility-administered medications on file prior to visit.   Allergies  Allergen Reactions  . Codeine Nausea And Vomiting   Social History   Socioeconomic History  . Marital status: Married    Spouse name: Not on file  . Number of children: Not on file  . Years of education: Not on file  . Highest education level: Not on file  Occupational History  . Not on file  Tobacco Use  . Smoking status: Former Smoker    Types: Cigarettes  . Smokeless tobacco: Never Used  . Tobacco comment: Quit over 20 years ago  Substance and Sexual Activity  . Alcohol use: No  . Drug use: No  . Sexual activity: Yes    Birth control/protection: Surgical  Other Topics Concern  . Not on file  Social History Narrative  . Not on file   Social Determinants of Health   Financial Resource Strain: Not on file  Food Insecurity: Not on file  Transportation Needs: Not on file  Physical Activity: Not on file  Stress: Not on file  Social Connections: Not on file  Intimate Partner Violence: Not on file     Review of Systems  All other systems reviewed and are negative.  Objective:   Physical Exam Vitals reviewed.  Constitutional:      Appearance: She is well-developed.  HENT:     Right Ear: External ear normal.     Left Ear: External ear normal.     Nose: Nose normal.     Mouth/Throat:     Pharynx: No oropharyngeal exudate.  Cardiovascular:     Rate and Rhythm: Normal rate and regular rhythm.     Heart sounds: Normal heart sounds.  Pulmonary:     Effort: Pulmonary effort is normal. No respiratory distress.     Breath sounds: Normal breath sounds. No wheezing or rales.  Abdominal:     General: Bowel sounds are normal.     Palpations: Abdomen is soft.  Musculoskeletal:     Cervical back: Neck supple.  Lymphadenopathy:     Cervical: No cervical adenopathy.           Assessment & Plan:  Benign essential HTN - Plan: CBC with  Differential/Platelet, COMPLETE METABOLIC PANEL WITH GFR, Lipid panel  Hyperlipidemia, unspecified hyperlipidemia type - Plan: CBC with Differential/Platelet, COMPLETE METABOLIC PANEL WITH GFR, Lipid panel  Encounter for screening mammogram for malignant neoplasm of breast - Plan: MM Digital Screening  Colon cancer screening - Plan: Ambulatory referral to Gastroenterology  Recommended both the flu shot and a Covid shot.  Blood pressure is excellent.  Check CBC, CMP, lipid panel.  Schedule the patient for mammogram.  Schedule the patient for colonoscopy.

## 2020-04-26 ENCOUNTER — Other Ambulatory Visit: Payer: Self-pay

## 2020-04-26 ENCOUNTER — Telehealth: Payer: Self-pay

## 2020-04-26 DIAGNOSIS — E876 Hypokalemia: Secondary | ICD-10-CM

## 2020-04-26 MED ORDER — POTASSIUM CHLORIDE CRYS ER 20 MEQ PO TBCR: 20.0000 meq | EXTENDED_RELEASE_TABLET | Freq: Every day | ORAL | 0 refills | Status: DC

## 2020-04-26 NOTE — Telephone Encounter (Signed)
Spoke with pt, says that it has been a while since she's taken potassium supplements. Rx sent to preferred pharmacy

## 2020-04-26 NOTE — Telephone Encounter (Signed)
Pt is returning call for lab results  

## 2020-05-06 ENCOUNTER — Other Ambulatory Visit: Payer: Self-pay | Admitting: Family Medicine

## 2020-05-06 DIAGNOSIS — I10 Essential (primary) hypertension: Secondary | ICD-10-CM

## 2020-05-25 ENCOUNTER — Other Ambulatory Visit: Payer: Self-pay | Admitting: Family Medicine

## 2020-05-25 DIAGNOSIS — E876 Hypokalemia: Secondary | ICD-10-CM

## 2020-06-07 ENCOUNTER — Other Ambulatory Visit: Payer: Self-pay | Admitting: Family Medicine

## 2020-06-07 DIAGNOSIS — E559 Vitamin D deficiency, unspecified: Secondary | ICD-10-CM

## 2020-06-08 ENCOUNTER — Encounter: Payer: Self-pay | Admitting: Family Medicine

## 2020-06-09 NOTE — Telephone Encounter (Signed)
Ok to refill??  Last office visit 04/23/2020.  Last refill 02/05/2020, #3 refills .

## 2020-06-30 ENCOUNTER — Other Ambulatory Visit: Payer: Self-pay | Admitting: Family Medicine

## 2020-06-30 DIAGNOSIS — G5682 Other specified mononeuropathies of left upper limb: Secondary | ICD-10-CM

## 2020-09-29 ENCOUNTER — Other Ambulatory Visit: Payer: Self-pay | Admitting: Family Medicine

## 2020-09-29 DIAGNOSIS — E559 Vitamin D deficiency, unspecified: Secondary | ICD-10-CM

## 2020-09-30 ENCOUNTER — Encounter: Payer: Self-pay | Admitting: Family Medicine

## 2020-09-30 NOTE — Telephone Encounter (Signed)
Ok to refill??  Last office visit 04/23/2020.  Last refill 06/10/2020, #3 refills.

## 2020-10-02 ENCOUNTER — Other Ambulatory Visit: Payer: Self-pay | Admitting: Family Medicine

## 2020-10-25 ENCOUNTER — Other Ambulatory Visit: Payer: Self-pay | Admitting: Family Medicine

## 2020-10-25 DIAGNOSIS — G5682 Other specified mononeuropathies of left upper limb: Secondary | ICD-10-CM

## 2020-10-30 ENCOUNTER — Other Ambulatory Visit: Payer: Self-pay | Admitting: Family Medicine

## 2020-10-30 DIAGNOSIS — I10 Essential (primary) hypertension: Secondary | ICD-10-CM

## 2020-11-23 ENCOUNTER — Other Ambulatory Visit: Payer: Self-pay

## 2020-11-23 ENCOUNTER — Ambulatory Visit
Admission: RE | Admit: 2020-11-23 | Discharge: 2020-11-23 | Disposition: A | Discharge status: Home / Self Care | Payer: BC Managed Care – PPO | Source: Ambulatory Visit | Attending: Family Medicine | Admitting: Family Medicine

## 2020-11-23 DIAGNOSIS — Z1231 Encounter for screening mammogram for malignant neoplasm of breast: Secondary | ICD-10-CM | POA: Diagnosis not present

## 2021-01-31 ENCOUNTER — Other Ambulatory Visit: Payer: Self-pay | Admitting: Family Medicine

## 2021-01-31 DIAGNOSIS — E559 Vitamin D deficiency, unspecified: Secondary | ICD-10-CM

## 2021-01-31 DIAGNOSIS — G5682 Other specified mononeuropathies of left upper limb: Secondary | ICD-10-CM

## 2021-02-01 ENCOUNTER — Other Ambulatory Visit: Payer: Self-pay

## 2021-02-01 DIAGNOSIS — G5682 Other specified mononeuropathies of left upper limb: Secondary | ICD-10-CM

## 2021-02-01 DIAGNOSIS — E559 Vitamin D deficiency, unspecified: Secondary | ICD-10-CM

## 2021-02-01 MED ORDER — ZOLPIDEM TARTRATE 10 MG PO TABS: ORAL_TABLET | ORAL | 3 refills | Status: DC

## 2021-02-01 MED ORDER — CYCLOBENZAPRINE HCL 10 MG PO TABS: 10.0000 mg | ORAL_TABLET | Freq: Three times a day (TID) | ORAL | 2 refills | Status: DC | PRN

## 2021-02-01 NOTE — Telephone Encounter (Signed)
Is this okay to refill   Last date filled 09/30/20 ambein Last date filled 10/25/20 flexeril  Last ov12/10/21

## 2021-05-08 ENCOUNTER — Other Ambulatory Visit: Payer: Self-pay | Admitting: Family Medicine

## 2021-05-08 DIAGNOSIS — G5682 Other specified mononeuropathies of left upper limb: Secondary | ICD-10-CM

## 2021-05-08 DIAGNOSIS — I10 Essential (primary) hypertension: Secondary | ICD-10-CM

## 2021-05-10 ENCOUNTER — Other Ambulatory Visit: Payer: Self-pay | Admitting: Family Medicine

## 2021-05-10 DIAGNOSIS — I10 Essential (primary) hypertension: Secondary | ICD-10-CM

## 2021-05-12 ENCOUNTER — Other Ambulatory Visit: Payer: Self-pay | Admitting: Family Medicine

## 2021-06-05 ENCOUNTER — Other Ambulatory Visit: Payer: Self-pay | Admitting: Family Medicine

## 2021-06-05 DIAGNOSIS — E559 Vitamin D deficiency, unspecified: Secondary | ICD-10-CM

## 2021-06-06 ENCOUNTER — Other Ambulatory Visit: Payer: Self-pay | Admitting: Family Medicine

## 2021-06-06 DIAGNOSIS — I10 Essential (primary) hypertension: Secondary | ICD-10-CM

## 2021-06-06 NOTE — Telephone Encounter (Signed)
Ambien refill request.  Needs appointment.

## 2021-06-10 ENCOUNTER — Encounter: Payer: Self-pay | Admitting: Family Medicine

## 2021-06-10 ENCOUNTER — Ambulatory Visit (INDEPENDENT_AMBULATORY_CARE_PROVIDER_SITE_OTHER): Payer: BC Managed Care – PPO | Admitting: Family Medicine

## 2021-06-10 ENCOUNTER — Other Ambulatory Visit: Payer: Self-pay

## 2021-06-10 VITALS — BP 138/82 | HR 117 | Temp 97.1°F | Resp 18 | Ht 61.0 in | Wt 191.0 lb

## 2021-06-10 DIAGNOSIS — I10 Essential (primary) hypertension: Secondary | ICD-10-CM

## 2021-06-10 DIAGNOSIS — R0600 Dyspnea, unspecified: Secondary | ICD-10-CM

## 2021-06-10 NOTE — Progress Notes (Signed)
Subjective:    Patient ID: [name], female    DOB: Mar 04, 1966, 56 y.o.   MRN: [id number]  Patient is here today for medicine check.  She has a history of hypertension for which she is currently on losartan/is a.  Her blood pressure is well controlled.  She is also on pravastatin for hyperlipidemia.  She denies any myalgias or RUQ pain.  However she has tachycardia today.  She denies any syncope or near syncope.  She is in normal sinus rhythm.  She states that her heart rate at home is typically between 110 and 120.  She denies any weight loss.  She denies any palpitations.  She denies any heat or cold intolerance.  She does report some shortness of breath with activity. Past Medical History:  Diagnosis Date   Breast cancer ([place]) 12/2005   Right breast   Cancer ([place]) 02/2007   Renal cell carcinoma - Left kidney   Fibromyalgia    Hypertension    Insomnia    Ruptured disc, cervical    Past Surgical History:  Procedure Laterality Date   CERVICAL DISC SURGERY     CESAREAN SECTION     C-section x 3   KNEE ARTHROSCOPY Right 1998   MASTECTOMY Right 02/02/2006   PARTIAL NEPHRECTOMY Left 06/11/2006   PORT-A-CATH REMOVAL     SALPINGOOPHORECTOMY     Current Outpatient Medications on File Prior to Visit  Medication Sig Dispense Refill   cyclobenzaprine (FLEXERIL) 10 MG tablet TAKE 1 TABLET(10 MG) BY MOUTH THREE TIMES DAILY AS NEEDED FOR MUSCLE SPASMS 30 tablet 2   losartan-hydrochlorothiazide (HYZAAR) 100-12.5 MG tablet TAKE 1 TABLET BY MOUTH DAILY 30 tablet 0   pravastatin (PRAVACHOL) 40 MG tablet TAKE 1 TABLET(40 MG) BY MOUTH DAILY 30 tablet 0   zolpidem (AMBIEN) 10 MG tablet TAKE 1 TABLET(10 MG) BY MOUTH AT BEDTIME 30 tablet 3   potassium chloride SA (KLOR-CON) 20 MEQ tablet TAKE 1 TABLET(20 MEQ) BY MOUTH DAILY (Patient not taking: Reported on 06/10/2021) 90 tablet 3   No current facility-administered medications on file prior to visit.   Allergies  Allergen Reactions   Codeine  Nausea And Vomiting   Social History   Socioeconomic History   Marital status: Married    Spouse name: Not on file   Number of children: Not on file   Years of education: Not on file   Highest education level: Not on file  Occupational History   Not on file  Tobacco Use   Smoking status: Former    Types: Cigarettes   Smokeless tobacco: Never   Tobacco comments:    Quit over 20 years ago  Substance and Sexual Activity   Alcohol use: No   Drug use: No   Sexual activity: Yes    Birth control/protection: Surgical  Other Topics Concern   Not on file  Social History Narrative   Not on file   Social Determinants of Health   Financial Resource Strain: Not on file  Food Insecurity: Not on file  Transportation Needs: Not on file  Physical Activity: Not on file  Stress: Not on file  Social Connections: Not on file  Intimate Partner Violence: Not on file     Review of Systems  All other systems reviewed and are negative.     Objective:   Physical Exam Vitals reviewed.  Constitutional:      Appearance: She is well-developed.  HENT:     Right Ear: External ear normal.  Left Ear: External ear normal.     Nose: Nose normal.     Mouth/Throat:     Pharynx: No oropharyngeal exudate.  Neck:     Vascular: No carotid bruit.  Cardiovascular:     Rate and Rhythm: Regular rhythm. Tachycardia present.     Heart sounds: Normal heart sounds. No murmur heard.   No friction rub. No gallop.  Pulmonary:     Effort: Pulmonary effort is normal. No respiratory distress.     Breath sounds: Normal breath sounds. No wheezing or rales.  Abdominal:     General: Bowel sounds are normal.     Palpations: Abdomen is soft.  Musculoskeletal:     Cervical back: Neck supple. No rigidity.     Right lower leg: No edema.     Left lower leg: No edema.  Lymphadenopathy:     Cervical: No cervical adenopathy.          Assessment & Plan:  Benign essential HTN - Plan: CBC with  Differential/Platelet, COMPLETE METABOLIC PANEL WITH GFR  Dyspnea, unspecified type - Plan: ECHOCARDIOGRAM COMPLETE Patient appears euvolemic today.  She denies any symptoms of hyperthyroidism.  Blood pressures well controlled.  Given the tachycardia, I will check a CMP and a CBC.  I would like to check a fasting lipid panel but she is not fasting.  I do not suspect hyperthyroidism.  She does not appear dehydrated.  I will rule out anemia with a CBC.  If lab work is normal, I would recommend an echocardiogram.  She does have a history of chemotherapy related to her breast cancer.  I question if the patient may have underlying cardiomyopathy given her dyspnea on exertion, her fatigue, and resting tachycardia.  Therefore I will obtain an echocardiogram.

## 2021-06-11 LAB — CBC WITH DIFFERENTIAL/PLATELET
Absolute Monocytes: 763 cells/uL (ref 200–950)
Basophils Absolute: 98 cells/uL (ref 0–200)
Basophils Relative: 0.9 %
Eosinophils Absolute: 153 cells/uL (ref 15–500)
Eosinophils Relative: 1.4 %
HCT: 43.4 % (ref 35.0–45.0)
Hemoglobin: 14.7 g/dL (ref 11.7–15.5)
Lymphs Abs: 2780 cells/uL (ref 850–3900)
MCH: 30.4 pg (ref 27.0–33.0)
MCHC: 33.9 g/dL (ref 32.0–36.0)
MCV: 89.7 fL (ref 80.0–100.0)
MPV: 10.7 fL (ref 7.5–12.5)
Monocytes Relative: 7 %
Neutro Abs: 7107 cells/uL (ref 1500–7800)
Neutrophils Relative %: 65.2 %
Platelets: 377 10*3/uL (ref 140–400)
RBC: 4.84 10*6/uL (ref 3.80–5.10)
RDW: 13 % (ref 11.0–15.0)
Total Lymphocyte: 25.5 %
WBC: 10.9 10*3/uL — ABNORMAL HIGH (ref 3.8–10.8)

## 2021-06-11 LAB — COMPLETE METABOLIC PANEL WITH GFR
AG Ratio: 1.3 (calc) (ref 1.0–2.5)
ALT: 46 U/L — ABNORMAL HIGH (ref 6–29)
AST: 50 U/L — ABNORMAL HIGH (ref 10–35)
Albumin: 4.4 g/dL (ref 3.6–5.1)
Alkaline phosphatase (APISO): 104 U/L (ref 37–153)
BUN: 12 mg/dL (ref 7–25)
CO2: 25 mmol/L (ref 20–32)
Calcium: 10.3 mg/dL (ref 8.6–10.4)
Chloride: 102 mmol/L (ref 98–110)
Creat: 0.94 mg/dL (ref 0.50–1.03)
Globulin: 3.3 g/dL (calc) (ref 1.9–3.7)
Glucose, Bld: 168 mg/dL — ABNORMAL HIGH (ref 65–99)
Potassium: 3.3 mmol/L — ABNORMAL LOW (ref 3.5–5.3)
Sodium: 140 mmol/L (ref 135–146)
Total Bilirubin: 0.8 mg/dL (ref 0.2–1.2)
Total Protein: 7.7 g/dL (ref 6.1–8.1)
eGFR: 71 mL/min/{1.73_m2} (ref >=60)

## 2021-06-13 ENCOUNTER — Other Ambulatory Visit: Payer: Self-pay | Admitting: Family Medicine

## 2021-06-13 ENCOUNTER — Encounter: Payer: Self-pay | Admitting: Family Medicine

## 2021-06-13 DIAGNOSIS — I10 Essential (primary) hypertension: Secondary | ICD-10-CM

## 2021-06-13 DIAGNOSIS — E559 Vitamin D deficiency, unspecified: Secondary | ICD-10-CM

## 2021-06-13 NOTE — Telephone Encounter (Signed)
Ambien refill request.  Last seen 06/10/2021, last filled 02/01/2021.

## 2021-06-14 ENCOUNTER — Other Ambulatory Visit: Payer: Self-pay

## 2021-06-14 MED ORDER — POTASSIUM CHLORIDE CRYS ER 20 MEQ PO TBCR: 40.0000 meq | EXTENDED_RELEASE_TABLET | Freq: Every day | ORAL | 1 refills | Status: DC

## 2021-06-24 ENCOUNTER — Other Ambulatory Visit: Payer: Self-pay

## 2021-06-24 ENCOUNTER — Ambulatory Visit (HOSPITAL_COMMUNITY): Payer: BC Managed Care – PPO | Attending: Cardiology

## 2021-06-24 DIAGNOSIS — R0609 Other forms of dyspnea: Secondary | ICD-10-CM

## 2021-06-24 DIAGNOSIS — R0600 Dyspnea, unspecified: Secondary | ICD-10-CM | POA: Diagnosis not present

## 2021-06-24 LAB — ECHOCARDIOGRAM COMPLETE
Area-P 1/2: 5.62 cm2
S' Lateral: 2.9 cm

## 2021-07-11 ENCOUNTER — Other Ambulatory Visit: Payer: Self-pay | Admitting: Family Medicine

## 2021-07-11 DIAGNOSIS — I10 Essential (primary) hypertension: Secondary | ICD-10-CM

## 2021-08-10 ENCOUNTER — Other Ambulatory Visit: Payer: Self-pay | Admitting: Family Medicine

## 2021-08-10 DIAGNOSIS — G5682 Other specified mononeuropathies of left upper limb: Secondary | ICD-10-CM

## 2021-08-11 ENCOUNTER — Other Ambulatory Visit: Payer: BC Managed Care – PPO

## 2021-10-08 ENCOUNTER — Other Ambulatory Visit: Payer: Self-pay | Admitting: Family Medicine

## 2021-10-08 DIAGNOSIS — E559 Vitamin D deficiency, unspecified: Secondary | ICD-10-CM

## 2021-10-11 NOTE — Telephone Encounter (Signed)
Last ov 06/10/21 Last filled 09/09/21  Next OV  None

## 2021-11-01 ENCOUNTER — Other Ambulatory Visit: Payer: Self-pay | Admitting: Family Medicine

## 2021-11-01 DIAGNOSIS — G5682 Other specified mononeuropathies of left upper limb: Secondary | ICD-10-CM

## 2021-11-01 NOTE — Telephone Encounter (Signed)
Requested medication (s) are due for refill today: yes  Requested medication (s) are on the active medication list: yes  Last refill:  08/11/21 #30 2 refills  Future visit scheduled: yes in 1 week   Notes to clinic:  not delegated per protocol      Requested Prescriptions  Pending Prescriptions Disp Refills   cyclobenzaprine (FLEXERIL) 10 MG tablet [Pharmacy Med Name: CYCLOBENZAPRINE '10MG'$  TABLETS] 30 tablet 2    Sig: TAKE 1 TABLET(10 MG) BY MOUTH THREE TIMES DAILY AS NEEDED FOR MUSCLE SPASMS     Not Delegated - Analgesics:  Muscle Relaxants Failed - 11/01/2021  2:50 PM      Failed - This refill cannot be delegated      Passed - Valid encounter within last 6 months    Recent Outpatient Visits           4 months ago Benign essential HTN   [place] Susy Frizzle, MD   1 year ago Benign essential HTN   [place] Dennard Schaumann, Cammie Mcgee, MD   1 year ago Fever, unspecified fever cause   [place] Susy Frizzle, MD   2 years ago Benign essential HTN   [place], Cammie Mcgee, MD   3 years ago Pinched nerve in shoulder, left   [place], [place]ena Nunnery, MD       Future Appointments             In 1 week [place], Cammie Mcgee, MD [place]

## 2021-11-11 ENCOUNTER — Ambulatory Visit: Payer: Self-pay | Admitting: Family Medicine

## 2021-11-28 ENCOUNTER — Ambulatory Visit (INDEPENDENT_AMBULATORY_CARE_PROVIDER_SITE_OTHER): Payer: BC Managed Care – PPO | Admitting: Family Medicine

## 2021-11-28 VITALS — BP 134/80 | HR 80 | Temp 98.7°F | Resp 16 | Wt 198.0 lb

## 2021-11-28 DIAGNOSIS — I10 Essential (primary) hypertension: Secondary | ICD-10-CM

## 2021-11-28 DIAGNOSIS — E785 Hyperlipidemia, unspecified: Secondary | ICD-10-CM | POA: Diagnosis not present

## 2021-11-28 DIAGNOSIS — R739 Hyperglycemia, unspecified: Secondary | ICD-10-CM | POA: Diagnosis not present

## 2021-11-28 NOTE — Progress Notes (Signed)
Subjective:    Patient ID: [name], female    DOB: 1965/09/25, 56 y.o.   MRN: [id number] Patient states her heart rate at times is up to 100.  She denies any chest pain from this.  She denies any shortness of breath.  She denies any dyspnea on exertion.  Echocardiogram was normal.  She does have a history of hypokalemia and she is currently on losartan hydrochlorothiazide.  We discussed discontinuing hydrochlorothiazide to help reduce the risk of hypokalemia and switching the hydrochlorothiazide to Toprol to help regulate her heart rate.  Patient is hesitant to make any changes at the present time.  Otherwise she is doing well with no concerns  Past Medical History:  Diagnosis Date   Breast cancer ([place]) 12/2005   Right breast   Cancer ([place]) 02/2007   Renal cell carcinoma - Left kidney   Fibromyalgia    Hypertension    Insomnia    Ruptured disc, cervical    Past Surgical History:  Procedure Laterality Date   CERVICAL DISC SURGERY     CESAREAN SECTION     C-section x 3   KNEE ARTHROSCOPY Right 1998   MASTECTOMY Right 02/02/2006   PARTIAL NEPHRECTOMY Left 06/11/2006   PORT-A-CATH REMOVAL     SALPINGOOPHORECTOMY     Current Outpatient Medications on File Prior to Visit  Medication Sig Dispense Refill   zolpidem (AMBIEN) 10 MG tablet TAKE 1 TABLET(10 MG) BY MOUTH AT BEDTIME 30 tablet 1   cyclobenzaprine (FLEXERIL) 10 MG tablet TAKE 1 TABLET(10 MG) BY MOUTH THREE TIMES DAILY AS NEEDED FOR MUSCLE SPASMS 30 tablet 2   losartan-hydrochlorothiazide (HYZAAR) 100-12.5 MG tablet TAKE 1 TABLET BY MOUTH DAILY 90 tablet 3   potassium chloride SA (KLOR-CON M) 20 MEQ tablet Take 2 tablets (40 mEq total) by mouth daily. 180 tablet 1   pravastatin (PRAVACHOL) 40 MG tablet TAKE 1 TABLET(40 MG) BY MOUTH DAILY 90 tablet 1   No current facility-administered medications on file prior to visit.   Allergies  Allergen Reactions   Codeine Nausea And Vomiting   Social History   Socioeconomic  History   Marital status: Married    Spouse name: Not on file   Number of children: Not on file   Years of education: Not on file   Highest education level: Not on file  Occupational History   Not on file  Tobacco Use   Smoking status: Former    Types: Cigarettes   Smokeless tobacco: Never   Tobacco comments:    Quit over 20 years ago  Substance and Sexual Activity   Alcohol use: No   Drug use: No   Sexual activity: Yes    Birth control/protection: Surgical  Other Topics Concern   Not on file  Social History Narrative   Not on file   Social Determinants of Health   Financial Resource Strain: Not on file  Food Insecurity: Not on file  Transportation Needs: Not on file  Physical Activity: Not on file  Stress: Not on file  Social Connections: Not on file  Intimate Partner Violence: Not on file     Review of Systems  All other systems reviewed and are negative.      Objective:   Physical Exam Vitals reviewed.  Constitutional:      Appearance: She is well-developed.  HENT:     Right Ear: External ear normal.     Left Ear: External ear normal.     Nose: Nose normal.  Mouth/Throat:     Pharynx: No oropharyngeal exudate.  Neck:     Vascular: No carotid bruit.  Cardiovascular:     Rate and Rhythm: Regular rhythm. Tachycardia present.     Heart sounds: Normal heart sounds. No murmur heard.    No friction rub. No gallop.  Pulmonary:     Effort: Pulmonary effort is normal. No respiratory distress.     Breath sounds: Normal breath sounds. No wheezing or rales.  Abdominal:     General: Bowel sounds are normal.     Palpations: Abdomen is soft.  Musculoskeletal:     Cervical back: Neck supple. No rigidity.     Right lower leg: No edema.     Left lower leg: No edema.  Lymphadenopathy:     Cervical: No cervical adenopathy.           Assessment & Plan:  Benign essential HTN - Plan: CBC with Differential/Platelet, Lipid panel, COMPLETE METABOLIC PANEL WITH  GFR  Hyperlipidemia, unspecified hyperlipidemia type - Plan: CBC with Differential/Platelet, Lipid panel, COMPLETE METABOLIC PANEL WITH GFR I discussed with her discontinuing Hyzaar and replacing it with a combination of losartan and Toprol.  This would reduce the risk of hypokalemia and better regulate her heart rate.  She could potentially discontinue potassium supplement at that point.  However, patient feels well at this time and is hesitant to make any changes.  Therefore I will obtain fasting lab work today and if lab work looks good I will make no changes at the present time.

## 2021-12-01 LAB — TEST AUTHORIZATION

## 2021-12-01 LAB — COMPLETE METABOLIC PANEL WITH GFR
AG Ratio: 1.2 (calc) (ref 1.0–2.5)
ALT: 31 U/L — ABNORMAL HIGH (ref 6–29)
AST: 29 U/L (ref 10–35)
Albumin: 4.2 g/dL (ref 3.6–5.1)
Alkaline phosphatase (APISO): 102 U/L (ref 37–153)
BUN: 19 mg/dL (ref 7–25)
CO2: 22 mmol/L (ref 20–32)
Calcium: 9.9 mg/dL (ref 8.6–10.4)
Chloride: 102 mmol/L (ref 98–110)
Creat: 0.89 mg/dL (ref 0.50–1.03)
Globulin: 3.4 g/dL (calc) (ref 1.9–3.7)
Glucose, Bld: 124 mg/dL — ABNORMAL HIGH (ref 65–99)
Potassium: 4.3 mmol/L (ref 3.5–5.3)
Sodium: 137 mmol/L (ref 135–146)
Total Bilirubin: 0.6 mg/dL (ref 0.2–1.2)
Total Protein: 7.6 g/dL (ref 6.1–8.1)
eGFR: 76 mL/min/{1.73_m2} (ref >=60)

## 2021-12-01 LAB — LIPID PANEL
Cholesterol: 170 mg/dL (ref <200)
HDL: 57 mg/dL (ref >=50)
LDL Cholesterol (Calc): 91 mg/dL (calc)
Non-HDL Cholesterol (Calc): 113 mg/dL (calc) (ref <130)
Total CHOL/HDL Ratio: 3 (calc) (ref <5.0)
Triglycerides: 128 mg/dL (ref <150)

## 2021-12-01 LAB — CBC WITH DIFFERENTIAL/PLATELET
Absolute Monocytes: 832 cells/uL (ref 200–950)
Basophils Absolute: 67 cells/uL (ref 0–200)
Basophils Relative: 0.8 %
Eosinophils Absolute: 193 cells/uL (ref 15–500)
Eosinophils Relative: 2.3 %
HCT: 44.2 % (ref 35.0–45.0)
Hemoglobin: 15.2 g/dL (ref 11.7–15.5)
Lymphs Abs: 2738 cells/uL (ref 850–3900)
MCH: 30.7 pg (ref 27.0–33.0)
MCHC: 34.4 g/dL (ref 32.0–36.0)
MCV: 89.3 fL (ref 80.0–100.0)
MPV: 10.8 fL (ref 7.5–12.5)
Monocytes Relative: 9.9 %
Neutro Abs: 4570 cells/uL (ref 1500–7800)
Neutrophils Relative %: 54.4 %
Platelets: 297 10*3/uL (ref 140–400)
RBC: 4.95 10*6/uL (ref 3.80–5.10)
RDW: 13.1 % (ref 11.0–15.0)
Total Lymphocyte: 32.6 %
WBC: 8.4 10*3/uL (ref 3.8–10.8)

## 2021-12-01 LAB — HEMOGLOBIN A1C W/OUT EAG: Hgb A1c MFr Bld: 6.2 % of total Hgb — ABNORMAL HIGH (ref <5.7)

## 2021-12-08 ENCOUNTER — Other Ambulatory Visit: Payer: Self-pay | Admitting: Family Medicine

## 2021-12-08 DIAGNOSIS — E559 Vitamin D deficiency, unspecified: Secondary | ICD-10-CM

## 2021-12-09 NOTE — Telephone Encounter (Signed)
Requested medication (s) are due for refill today: yes  Requested medication (s) are on the active medication list:yes  Last refill:  10/11/21  Future visit scheduled:no  Notes to clinic:  Unable to refill per protocol, cannot delegate Ambien. Pt last OV was 11/28/21.      Requested Prescriptions  Pending Prescriptions Disp Refills   zolpidem (AMBIEN) 10 MG tablet [Pharmacy Med Name: ZOLPIDEM '10MG'$  TABLETS] 30 tablet     Sig: TAKE 1 TABLET(10 MG) BY MOUTH AT BEDTIME     Not Delegated - Psychiatry:  Anxiolytics/Hypnotics Failed - 12/08/2021 11:53 AM      Failed - This refill cannot be delegated      Failed - Urine Drug Screen completed in last 360 days      Failed - Valid encounter within last 6 months    Recent Outpatient Visits           6 months ago Benign essential HTN   [place] Susy Frizzle, MD   1 year ago Benign essential HTN   [place] Schaumann, Cammie Mcgee, MD   1 year ago Fever, unspecified fever cause   [place] Susy Frizzle, MD   2 years ago Benign essential HTN   [place], Cammie Mcgee, MD   3 years ago Pinched nerve in shoulder, left   [place], Modena Nunnery, MD

## 2021-12-12 ENCOUNTER — Other Ambulatory Visit: Payer: Self-pay | Admitting: Family Medicine

## 2021-12-12 ENCOUNTER — Telehealth: Payer: Self-pay

## 2021-12-12 DIAGNOSIS — E559 Vitamin D deficiency, unspecified: Secondary | ICD-10-CM

## 2021-12-12 MED ORDER — ZOLPIDEM TARTRATE 10 MG PO TABS: 10.0000 mg | ORAL_TABLET | Freq: Every evening | ORAL | 3 refills | Status: DC | PRN

## 2021-12-12 NOTE — Telephone Encounter (Signed)
Pt aware Rx called in today via her MyChart.

## 2021-12-12 NOTE — Telephone Encounter (Signed)
Pt called in requesting a refill of this med: zolpidem (AMBIEN) 10 MG tablet [[id number]]    Order Details Dose, Route, Frequency: As Directed  Dispense Quantity: 30 tablet Refills: 1        Sig: TAKE 1 TABLET(10 MG) BY MOUTH AT BEDTIME       Start Date: 10/11/21 End Date: --   LOV: 11/28/21

## 2021-12-24 ENCOUNTER — Other Ambulatory Visit: Payer: Self-pay | Admitting: Family Medicine

## 2021-12-26 NOTE — Telephone Encounter (Signed)
Requested Prescriptions  Pending Prescriptions Disp Refills  . potassium chloride SA (KLOR-CON M) 20 MEQ tablet [Pharmacy Med Name: POTASSIUM CL 20MEQ ER TABLETS] 180 tablet 1    Sig: TAKE 2 TABLETS BY MOUTH DAILY     Endocrinology:  Minerals - Potassium Supplementation Passed - 12/26/2021 10:08 AM      Passed - K in normal range and within 360 days    Potassium  Date Value Ref Range Status  11/28/2021 4.3 3.5 - 5.3 mmol/L Final  11/14/2016 3.6 3.5 - 5.1 mEq/L Final         Passed - Cr in normal range and within 360 days    Creatinine  Date Value Ref Range Status  11/14/2016 0.9 0.6 - 1.1 mg/dL Final   Creat  Date Value Ref Range Status  11/28/2021 0.89 0.50 - 1.03 mg/dL Final         Passed - Valid encounter within last 12 months    Recent Outpatient Visits          6 months ago Benign essential HTN   [place] Susy Frizzle, MD   1 year ago Benign essential HTN   [place] Susy Frizzle, MD   1 year ago Fever, unspecified fever cause   [place] Susy Frizzle, MD   2 years ago Benign essential HTN   [place], Cammie Mcgee, MD   3 years ago Pinched nerve in shoulder, left   [place], Modena Nunnery, MD

## 2022-01-29 ENCOUNTER — Other Ambulatory Visit: Payer: Self-pay | Admitting: Family Medicine

## 2022-01-30 NOTE — Telephone Encounter (Signed)
Requested Prescriptions  Pending Prescriptions Disp Refills  . pravastatin (PRAVACHOL) 40 MG tablet [Pharmacy Med Name: PRAVASTATIN '40MG'$  TABLETS] 90 tablet 2    Sig: TAKE 1 TABLET(40 MG) BY MOUTH DAILY     Cardiovascular:  Antilipid - Statins Failed - 01/29/2022  3:56 AM      Failed - Lipid Panel in normal range within the last 12 months    Cholesterol  Date Value Ref Range Status  11/28/2021 170 <200 mg/dL Final   LDL Cholesterol (Calc)  Date Value Ref Range Status  11/28/2021 91 mg/dL (calc) Final    Comment:    Reference range: <100 . Desirable range <100 mg/dL for primary prevention;   <70 mg/dL for patients with CHD or diabetic patients  with > or = 2 CHD risk factors. Marland Kitchen LDL-C is now calculated using the Martin-Hopkins  calculation, which is a validated novel method providing  better accuracy than the Friedewald equation in the  estimation of LDL-C.  Cresenciano Genre et al. Annamaria Helling. 5397;673(41): 2061-2068  (http://education.[facility].com/faq/FAQ164)    HDL  Date Value Ref Range Status  11/28/2021 57 > OR = 50 mg/dL Final   Triglycerides  Date Value Ref Range Status  11/28/2021 128 <150 mg/dL Final         Passed - Patient is not pregnant      Passed - Valid encounter within last 12 months    Recent Outpatient Visits          7 months ago Benign essential HTN   [place], Cammie Mcgee, MD   1 year ago Benign essential HTN   [place] Susy Frizzle, MD   2 years ago Fever, unspecified fever cause   [place] Susy Frizzle, MD   2 years ago Benign essential HTN   [place], Cammie Mcgee, MD   3 years ago Pinched nerve in shoulder, left   [place], Modena Nunnery, MD

## 2022-02-11 ENCOUNTER — Other Ambulatory Visit: Payer: Self-pay | Admitting: Family Medicine

## 2022-02-11 DIAGNOSIS — G5682 Other specified mononeuropathies of left upper limb: Secondary | ICD-10-CM

## 2022-03-13 ENCOUNTER — Other Ambulatory Visit: Payer: Self-pay | Admitting: Family Medicine

## 2022-03-13 DIAGNOSIS — E559 Vitamin D deficiency, unspecified: Secondary | ICD-10-CM

## 2022-03-14 NOTE — Telephone Encounter (Signed)
Requested medications are due for refill today.  yes  Requested medications are on the active medications list.  yes  Last refill. 12/12/2021 #30 3 rf  Future visit scheduled.   no  Notes to clinic.  Refill not delegated.    Requested Prescriptions  Pending Prescriptions Disp Refills   zolpidem (AMBIEN) 10 MG tablet [Pharmacy Med Name: ZOLPIDEM '10MG'$  TABLETS] 30 tablet     Sig: TAKE 1 TABLET(10 MG) BY MOUTH AT BEDTIME AS NEEDED FOR SLEEP     Not Delegated - Psychiatry:  Anxiolytics/Hypnotics Failed - 03/13/2022  7:51 AM      Failed - This refill cannot be delegated      Failed - Urine Drug Screen completed in last 360 days      Failed - Valid encounter within last 6 months    Recent Outpatient Visits           9 months ago Benign essential HTN   [place] Susy Frizzle, MD   1 year ago Benign essential HTN   [place] Susy Frizzle, MD   2 years ago Fever, unspecified fever cause   [place] Susy Frizzle, MD   2 years ago Benign essential HTN   [place], Cammie Mcgee, MD   3 years ago Pinched nerve in shoulder, left   [place], Modena Nunnery, MD

## 2022-05-19 ENCOUNTER — Other Ambulatory Visit: Payer: Self-pay | Admitting: Family Medicine

## 2022-05-19 DIAGNOSIS — G5682 Other specified mononeuropathies of left upper limb: Secondary | ICD-10-CM

## 2022-05-19 NOTE — Telephone Encounter (Signed)
Requested medication (s) are due for refill today: yes  Requested medication (s) are on the active medication list: yes   Last refill:  02/13/22 #30 2 refills  Future visit scheduled: no   Notes to clinic:  last OV 11/28/21. Not delegated per protocol. Do you want to refill Rx?     Requested Prescriptions  Pending Prescriptions Disp Refills   cyclobenzaprine (FLEXERIL) 10 MG tablet [Pharmacy Med Name: CYCLOBENZAPRINE '10MG'$  TABLETS] 30 tablet 2    Sig: TAKE 1 TABLET(10 MG) BY MOUTH THREE TIMES DAILY AS NEEDED FOR MUSCLE SPASMS     Not Delegated - Analgesics:  Muscle Relaxants Failed - 05/19/2022  3:20 PM      Failed - This refill cannot be delegated      Failed - Valid encounter within last 6 months    Recent Outpatient Visits           11 months ago Benign essential HTN   [place] Susy Frizzle, MD   2 years ago Benign essential HTN   [place] Susy Frizzle, MD   2 years ago Fever, unspecified fever cause   [place] Susy Frizzle, MD   2 years ago Benign essential HTN   [place], Cammie Mcgee, MD   3 years ago Pinched nerve in shoulder, left   [place], Modena Nunnery, MD

## 2022-06-28 ENCOUNTER — Other Ambulatory Visit: Payer: Self-pay | Admitting: Family Medicine

## 2022-06-28 NOTE — Telephone Encounter (Signed)
Requested Prescriptions  Pending Prescriptions Disp Refills   potassium chloride SA (KLOR-CON M) 20 MEQ tablet [Pharmacy Med Name: POTASSIUM CL 20MEQ ER TABLETS] 180 tablet 1    Sig: TAKE 2 TABLETS BY MOUTH DAILY     Endocrinology:  Minerals - Potassium Supplementation Failed - 06/28/2022  3:54 AM      Failed - Valid encounter within last 12 months    Recent Outpatient Visits           1 year ago Benign essential HTN   [place] Susy Frizzle, MD   2 years ago Benign essential HTN   [place] Susy Frizzle, MD   2 years ago Fever, unspecified fever cause   [place] Susy Frizzle, MD   3 years ago Benign essential HTN   [place] Susy Frizzle, MD   3 years ago Pinched nerve in shoulder, left   [place], Modena Nunnery, MD              Passed - K in normal range and within 360 days    Potassium  Date Value Ref Range Status  11/28/2021 4.3 3.5 - 5.3 mmol/L Final  11/14/2016 3.6 3.5 - 5.1 mEq/L Final         Passed - Cr in normal range and within 360 days    Creatinine  Date Value Ref Range Status  11/14/2016 0.9 0.6 - 1.1 mg/dL Final   Creat  Date Value Ref Range Status  11/28/2021 0.89 0.50 - 1.03 mg/dL Final

## 2022-07-11 ENCOUNTER — Other Ambulatory Visit: Payer: Self-pay | Admitting: Family Medicine

## 2022-07-11 DIAGNOSIS — I10 Essential (primary) hypertension: Secondary | ICD-10-CM

## 2022-08-09 ENCOUNTER — Other Ambulatory Visit: Payer: Self-pay | Admitting: Family Medicine

## 2022-08-09 DIAGNOSIS — G5682 Other specified mononeuropathies of left upper limb: Secondary | ICD-10-CM

## 2022-08-10 NOTE — Telephone Encounter (Signed)
Requested medication (s) are due for refill today: yes  Requested medication (s) are on the active medication list: yes  Last refill:  05/19/22  Future visit scheduled: yes  Notes to clinic:  Unable to refill per protocol, cannot delegate.      Requested Prescriptions  Pending Prescriptions Disp Refills   cyclobenzaprine (FLEXERIL) 10 MG tablet [Pharmacy Med Name: CYCLOBENZAPRINE 10MG  TABLETS] 30 tablet 2    Sig: TAKE 1 TABLET(10 MG) BY MOUTH THREE TIMES DAILY AS NEEDED FOR MUSCLE SPASMS     Not Delegated - Analgesics:  Muscle Relaxants Failed - 08/09/2022  9:46 PM      Failed - This refill cannot be delegated      Failed - Valid encounter within last 6 months    Recent Outpatient Visits           1 year ago Benign essential HTN   [place] Susy Frizzle, MD   2 years ago Benign essential HTN   [place] Susy Frizzle, MD   2 years ago Fever, unspecified fever cause   [place] Susy Frizzle, MD   3 years ago Benign essential HTN   [place], Cammie Mcgee, MD   3 years ago Pinched nerve in shoulder, left   [place], Modena Nunnery, MD

## 2022-09-28 LAB — HM COLONOSCOPY

## 2022-10-04 ENCOUNTER — Other Ambulatory Visit: Payer: Self-pay | Admitting: Family Medicine

## 2022-10-04 DIAGNOSIS — E559 Vitamin D deficiency, unspecified: Secondary | ICD-10-CM

## 2022-10-05 NOTE — Telephone Encounter (Signed)
Requested medication (s) are due for refill today - yes  Requested medication (s) are on the active medication list -yes  Future visit scheduled -no  Last refill: 03/14/22 #30 5RF  Notes to clinic: non delegated Rx  Requested Prescriptions  Pending Prescriptions Disp Refills   zolpidem (AMBIEN) 10 MG tablet [Pharmacy Med Name: ZOLPIDEM 10MG  TABLETS] 30 tablet     Sig: TAKE 1 TABLET(10 MG) BY MOUTH AT BEDTIME AS NEEDED FOR SLEEP     Not Delegated - Psychiatry:  Anxiolytics/Hypnotics Failed - 10/04/2022 10:18 PM      Failed - This refill cannot be delegated      Failed - Urine Drug Screen completed in last 360 days      Failed - Valid encounter within last 6 months    Recent Outpatient Visits           1 year ago Benign essential HTN   [facility] Donita Brooks, MD   2 years ago Benign essential HTN   [facility] Tanya Nones, Priscille Heidelberg, MD   2 years ago Fever, unspecified fever cause   [facility] Tanya Nones, Priscille Heidelberg, MD   3 years ago Benign essential HTN   [facility] Tanya Nones, Priscille Heidelberg, MD   3 years ago Pinched nerve in shoulder, left   [facility] [place], Velna Hatchet, MD                 Requested Prescriptions  Pending Prescriptions Disp Refills   zolpidem (AMBIEN) 10 MG tablet [Pharmacy Med Name: ZOLPIDEM 10MG  TABLETS] 30 tablet     Sig: TAKE 1 TABLET(10 MG) BY MOUTH AT BEDTIME AS NEEDED FOR SLEEP     Not Delegated - Psychiatry:  Anxiolytics/Hypnotics Failed - 10/04/2022 10:18 PM      Failed - This refill cannot be delegated      Failed - Urine Drug Screen completed in last 360 days      Failed - Valid encounter within last 6 months    Recent Outpatient Visits           1 year ago Benign essential HTN   [facility] Donita Brooks, MD   2 years ago Benign essential HTN   [facility] Donita Brooks, MD   2 years ago Fever, unspecified  fever cause   [facility] Donita Brooks, MD   3 years ago Benign essential HTN   [facility] [place], Priscille Heidelberg, MD   3 years ago Pinched nerve in shoulder, left   [facility] [place], Velna Hatchet, MD

## 2022-10-13 ENCOUNTER — Ambulatory Visit (INDEPENDENT_AMBULATORY_CARE_PROVIDER_SITE_OTHER): Payer: BC Managed Care – PPO | Admitting: Family Medicine

## 2022-10-13 ENCOUNTER — Encounter: Payer: Self-pay | Admitting: Family Medicine

## 2022-10-13 VITALS — BP 122/72 | HR 87 | Temp 98.7°F | Ht 61.0 in | Wt 200.6 lb

## 2022-10-13 DIAGNOSIS — E785 Hyperlipidemia, unspecified: Secondary | ICD-10-CM | POA: Diagnosis not present

## 2022-10-13 DIAGNOSIS — Z1211 Encounter for screening for malignant neoplasm of colon: Secondary | ICD-10-CM | POA: Diagnosis not present

## 2022-10-13 DIAGNOSIS — I10 Essential (primary) hypertension: Secondary | ICD-10-CM | POA: Diagnosis not present

## 2022-10-13 NOTE — Progress Notes (Signed)
Subjective:    Patient ID: [name], female    DOB: [birth year], 57 y.o.   MRN: [id number]  Patient is here today to follow-up of hypertension.  Her blood pressure today is outstanding.  She denies any chest pain, shortness of breath, or dyspnea on exertion.  She is due for fasting lab work.  She is long overdue for a Pap smear.  She is due for a colonoscopy.  She is due for her mammogram of her left breast.  We discussed this today in detail.  The patient defers her Pap smear today.  She would likely discuss a colonoscopy.  She prefers to schedule her mammogram.  We reviewed her shot records.  She is due for shingles vaccine which she defers today Past Medical History:  Diagnosis Date   Breast cancer (HCC) 12/2005   Right breast   Cancer (HCC) 02/2007   Renal cell carcinoma - Left kidney   Fibromyalgia    Hypertension    Insomnia    Ruptured disc, cervical    Past Surgical History:  Procedure Laterality Date   CERVICAL DISC SURGERY     CESAREAN SECTION     C-section x 3   KNEE ARTHROSCOPY Right 1998   MASTECTOMY Right 02/02/2006   PARTIAL NEPHRECTOMY Left 06/11/2006   PORT-A-CATH REMOVAL     SALPINGOOPHORECTOMY     Current Outpatient Medications on File Prior to Visit  Medication Sig Dispense Refill   cyclobenzaprine (FLEXERIL) 10 MG tablet TAKE 1 TABLET(10 MG) BY MOUTH THREE TIMES DAILY AS NEEDED FOR MUSCLE SPASMS 30 tablet 2   losartan-hydrochlorothiazide (HYZAAR) 100-12.5 MG tablet TAKE 1 TABLET BY MOUTH DAILY 90 tablet 3   potassium chloride SA (KLOR-CON M) 20 MEQ tablet TAKE 2 TABLETS BY MOUTH DAILY 180 tablet 1   pravastatin (PRAVACHOL) 40 MG tablet TAKE 1 TABLET(40 MG) BY MOUTH DAILY 90 tablet 2   zolpidem (AMBIEN) 10 MG tablet TAKE 1 TABLET(10 MG) BY MOUTH AT BEDTIME AS NEEDED FOR SLEEP 30 tablet 0   No current facility-administered medications on file prior to visit.   Allergies  Allergen Reactions   Codeine Nausea And Vomiting   Social History    Socioeconomic History   Marital status: Married    Spouse name: Not on file   Number of children: Not on file   Years of education: Not on file   Highest education level: Not on file  Occupational History   Not on file  Tobacco Use   Smoking status: Former    Types: Cigarettes   Smokeless tobacco: Never   Tobacco comments:    Quit over 20 years ago  Substance and Sexual Activity   Alcohol use: No   Drug use: No   Sexual activity: Yes    Birth control/protection: Surgical  Other Topics Concern   Not on file  Social History Narrative   Not on file   Social Determinants of Health   Financial Resource Strain: Not on file  Food Insecurity: Not on file  Transportation Needs: Not on file  Physical Activity: Not on file  Stress: Not on file  Social Connections: Not on file  Intimate Partner Violence: Not on file     Review of Systems  All other systems reviewed and are negative.      Objective:   Physical Exam Vitals reviewed.  Constitutional:      Appearance: She is well-developed.  HENT:     Right Ear: External ear normal.  Left Ear: External ear normal.     Nose: Nose normal.     Mouth/Throat:     Pharynx: No oropharyngeal exudate.  Neck:     Vascular: No carotid bruit.  Cardiovascular:     Rate and Rhythm: Regular rhythm. Tachycardia present.     Heart sounds: Normal heart sounds. No murmur heard.    No friction rub. No gallop.  Pulmonary:     Effort: Pulmonary effort is normal. No respiratory distress.     Breath sounds: Normal breath sounds. No wheezing or rales.  Abdominal:     General: Bowel sounds are normal.     Palpations: Abdomen is soft.  Musculoskeletal:     Cervical back: Neck supple. No rigidity.     Right lower leg: No edema.     Left lower leg: No edema.  Lymphadenopathy:     Cervical: No cervical adenopathy.           Assessment & Plan:  Hyperlipidemia, unspecified hyperlipidemia type - Plan: CBC with  Differential/Platelet, COMPLETE METABOLIC PANEL WITH GFR, Lipid panel  Benign essential HTN - Plan: CBC with Differential/Platelet, COMPLETE METABOLIC PANEL WITH GFR, Lipid panel  Colon cancer screening - Plan: Ambulatory referral to Gastroenterology I have very happy with her blood pressure.  Check CBC CMP and a lipid panel.  Would like LDL cholesterol under 1 3.  Patient scheduled for mammogram.  She declines her Pap smear today and prefers to schedule this at a later date.  She does asked that I schedule her for colonoscopy.  I recommended the shingles vaccine today and she politely defers.

## 2022-10-13 NOTE — Progress Notes (Signed)
[facility] Quality Team Note  Name: [name] Date of Birth: 1966-04-17 MRN: [id number] Date: 10/13/2022  [facility] Quality Team has reviewed this patient's chart, please see recommendations below:  [facility] Quality Other; (PATIENT DUE FOR BREAST CANCER SCREENING, COLON CANCER SCREENING, AND CONTROLLING BLOOD PRESSURE. PLEASE ADDRESS AT 10/13/2022 OFFICE VISIT)

## 2022-10-14 LAB — COMPLETE METABOLIC PANEL WITHOUT GFR
AG Ratio: 1.2 (calc) (ref 1.0–2.5)
ALT: 43 U/L — ABNORMAL HIGH (ref 6–29)
AST: 44 U/L — ABNORMAL HIGH (ref 10–35)
Albumin: 4.3 g/dL (ref 3.6–5.1)
Alkaline phosphatase (APISO): 100 U/L (ref 37–153)
BUN: 11 mg/dL (ref 7–25)
CO2: 23 mmol/L (ref 20–32)
Calcium: 9.9 mg/dL (ref 8.6–10.4)
Chloride: 104 mmol/L (ref 98–110)
Creat: 0.84 mg/dL (ref 0.50–1.03)
Globulin: 3.6 g/dL (ref 1.9–3.7)
Glucose, Bld: 113 mg/dL — ABNORMAL HIGH (ref 65–99)
Potassium: 4.4 mmol/L (ref 3.5–5.3)
Sodium: 138 mmol/L (ref 135–146)
Total Bilirubin: 0.8 mg/dL (ref 0.2–1.2)
Total Protein: 7.9 g/dL (ref 6.1–8.1)
eGFR: 81 mL/min/1.73m2

## 2022-10-14 LAB — CBC WITH DIFFERENTIAL/PLATELET
Absolute Monocytes: 873 cells/uL (ref 200–950)
Basophils Absolute: 90 cells/uL (ref 0–200)
Basophils Relative: 1 %
Eosinophils Absolute: 270 cells/uL (ref 15–500)
Eosinophils Relative: 3 %
HCT: 41.1 % (ref 35.0–45.0)
Hemoglobin: 14 g/dL (ref 11.7–15.5)
Lymphs Abs: 3069 cells/uL (ref 850–3900)
MCH: 30.3 pg (ref 27.0–33.0)
MCHC: 34.1 g/dL (ref 32.0–36.0)
MCV: 89 fL (ref 80.0–100.0)
MPV: 10.5 fL (ref 7.5–12.5)
Monocytes Relative: 9.7 %
Neutro Abs: 4698 cells/uL (ref 1500–7800)
Neutrophils Relative %: 52.2 %
Platelets: 335 10*3/uL (ref 140–400)
RBC: 4.62 10*6/uL (ref 3.80–5.10)
RDW: 13 % (ref 11.0–15.0)
Total Lymphocyte: 34.1 %
WBC: 9 10*3/uL (ref 3.8–10.8)

## 2022-10-14 LAB — LIPID PANEL
Cholesterol: 145 mg/dL
HDL: 54 mg/dL
LDL Cholesterol (Calc): 73 mg/dL
Non-HDL Cholesterol (Calc): 91 mg/dL
Total CHOL/HDL Ratio: 2.7 (calc)
Triglycerides: 94 mg/dL

## 2022-10-21 ENCOUNTER — Other Ambulatory Visit: Payer: Self-pay | Admitting: Family Medicine

## 2022-10-23 NOTE — Telephone Encounter (Signed)
Last OV 10/13/22.  Requested Prescriptions  Pending Prescriptions Disp Refills   pravastatin (PRAVACHOL) 40 MG tablet [Pharmacy Med Name: PRAVASTATIN 40MG  TABLETS] 90 tablet 0    Sig: TAKE 1 TABLET(40 MG) BY MOUTH DAILY     Cardiovascular:  Antilipid - Statins Failed - 10/21/2022  9:51 PM      Failed - Valid encounter within last 12 months    Recent Outpatient Visits           1 year ago Benign essential HTN   [facility] Donita Brooks, MD   2 years ago Benign essential HTN   [facility] Donita Brooks, MD   2 years ago Fever, unspecified fever cause   [facility] Tanya Nones, Priscille Heidelberg, MD   3 years ago Benign essential HTN   [facility] Tanya Nones, Priscille Heidelberg, MD   4 years ago Pinched nerve in shoulder, left   [facility] [place], Velna Hatchet, MD              Failed - Lipid Panel in normal range within the last 12 months    Cholesterol  Date Value Ref Range Status  10/13/2022 145 <200 mg/dL Final   LDL Cholesterol (Calc)  Date Value Ref Range Status  10/13/2022 73 mg/dL (calc) Final    Comment:    Reference range: <100 . Desirable range <100 mg/dL for primary prevention;   <70 mg/dL for patients with CHD or diabetic patients  with > or = 2 CHD risk factors. Marland Kitchen LDL-C is now calculated using the Martin-Hopkins  calculation, which is a validated novel method providing  better accuracy than the Friedewald equation in the  estimation of LDL-C.  Horald Pollen et al. Lenox Ahr. 1610;960(45): 2061-2068  (http://education.[facility].com/faq/FAQ164)    HDL  Date Value Ref Range Status  10/13/2022 54 > OR = 50 mg/dL Final   Triglycerides  Date Value Ref Range Status  10/13/2022 94 <150 mg/dL Final         Passed - Patient is not pregnant

## 2022-11-07 ENCOUNTER — Other Ambulatory Visit: Payer: Self-pay | Admitting: Family Medicine

## 2022-11-07 DIAGNOSIS — E559 Vitamin D deficiency, unspecified: Secondary | ICD-10-CM

## 2022-11-07 DIAGNOSIS — G5682 Other specified mononeuropathies of left upper limb: Secondary | ICD-10-CM

## 2022-11-08 NOTE — Telephone Encounter (Signed)
Requested medication (s) are due for refill today: yes  Requested medication (s) are on the active medication list: yes  Last refill:  08/10/22 and 5 24/24  Future visit scheduled: no  Notes to clinic:  Unable to refill per protocol, cannot delegate.      Requested Prescriptions  Pending Prescriptions Disp Refills   cyclobenzaprine (FLEXERIL) 10 MG tablet [Pharmacy Med Name: CYCLOBENZAPRINE 10MG  TABLETS] 30 tablet 2    Sig: TAKE 1 TABLET(10 MG) BY MOUTH THREE TIMES DAILY AS NEEDED FOR MUSCLE SPASMS     Not Delegated - Analgesics:  Muscle Relaxants Failed - 11/07/2022  2:03 PM      Failed - This refill cannot be delegated      Failed - Valid encounter within last 6 months    Recent Outpatient Visits           1 year ago Benign essential HTN   [facility] Donita Brooks, MD   2 years ago Benign essential HTN   [facility] Donita Brooks, MD   2 years ago Fever, unspecified fever cause   [facility] Tanya Nones, Priscille Heidelberg, MD   3 years ago Benign essential HTN   [facility] Tanya Nones, Priscille Heidelberg, MD   4 years ago Pinched nerve in shoulder, left   [facility] [place], Velna Hatchet, MD               zolpidem (AMBIEN) 10 MG tablet [Pharmacy Med Name: ZOLPIDEM 10MG  TABLETS] 30 tablet     Sig: TAKE 1 TABLET(10 MG) BY MOUTH AT BEDTIME AS NEEDED FOR SLEEP     Not Delegated - Psychiatry:  Anxiolytics/Hypnotics Failed - 11/07/2022  2:03 PM      Failed - This refill cannot be delegated      Failed - Urine Drug Screen completed in last 360 days      Failed - Valid encounter within last 6 months    Recent Outpatient Visits           1 year ago Benign essential HTN   [facility] Donita Brooks, MD   2 years ago Benign essential HTN   [facility] Donita Brooks, MD   2 years ago Fever, unspecified fever cause   [facility] Medicine Donita Brooks,  MD   3 years ago Benign essential HTN   [facility] Tanya Nones, Priscille Heidelberg, MD   4 years ago Pinched nerve in shoulder, left   [facility] Medicine [place], Velna Hatchet, MD

## 2022-12-31 ENCOUNTER — Other Ambulatory Visit: Payer: Self-pay | Admitting: Family Medicine

## 2023-01-02 NOTE — Telephone Encounter (Signed)
Last refill 10/01/22 within protocol.  Requested Prescriptions  Pending Prescriptions Disp Refills   potassium chloride SA (KLOR-CON M) 20 MEQ tablet [Pharmacy Med Name: POTASSIUM CL ER TABLETS] 180 tablet 0    Sig: TAKE 2 TABLETS BY MOUTH DAILY     Endocrinology:  Minerals - Potassium Supplementation Failed - 12/31/2022  3:55 AM      Failed - Valid encounter within last 12 months    Recent Outpatient Visits           1 year ago Benign essential HTN   [facility] Donita Brooks, MD   2 years ago Benign essential HTN   [facility] Donita Brooks, MD   2 years ago Fever, unspecified fever cause   [facility] Medicine Donita Brooks, MD   3 years ago Benign essential HTN   [facility] Donita Brooks, MD   4 years ago Pinched nerve in shoulder, left   [facility] Medicine [place], Velna Hatchet, MD              Passed - K in normal range and within 360 days    Potassium  Date Value Ref Range Status  10/13/2022 4.4 3.5 - 5.3 mmol/L Final  11/14/2016 3.6 3.5 - 5.1 mEq/L Final         Passed - Cr in normal range and within 360 days    Creatinine  Date Value Ref Range Status  11/14/2016 0.9 0.6 - 1.1 mg/dL Final   Creat  Date Value Ref Range Status  10/13/2022 0.84 0.50 - 1.03 mg/dL Final

## 2023-01-09 ENCOUNTER — Other Ambulatory Visit: Payer: Self-pay | Admitting: Family Medicine

## 2023-01-10 NOTE — Telephone Encounter (Signed)
Refused because it's a duplicate request.  Signed and sent to correct pharmacy.

## 2023-01-17 ENCOUNTER — Other Ambulatory Visit: Payer: Self-pay | Admitting: Family Medicine

## 2023-01-18 NOTE — Telephone Encounter (Signed)
Requested Prescriptions  Pending Prescriptions Disp Refills   pravastatin (PRAVACHOL) 40 MG tablet [Pharmacy Med Name: PRAVASTATIN 40MG  TABLETS] 90 tablet 0    Sig: TAKE 1 TABLET(40 MG) BY MOUTH DAILY     Cardiovascular:  Antilipid - Statins Failed - 01/17/2023 12:09 AM      Failed - Valid encounter within last 12 months    Recent Outpatient Visits           1 year ago Benign essential HTN   [facility] Donita Brooks, MD   2 years ago Benign essential HTN   [facility] Donita Brooks, MD   2 years ago Fever, unspecified fever cause   [facility] Tanya Nones, Priscille Heidelberg, MD   3 years ago Benign essential HTN   [facility] Tanya Nones, Priscille Heidelberg, MD   4 years ago Pinched nerve in shoulder, left   [facility] [place], Velna Hatchet, MD              Failed - Lipid Panel in normal range within the last 12 months    Cholesterol  Date Value Ref Range Status  10/13/2022 145 <200 mg/dL Final   LDL Cholesterol (Calc)  Date Value Ref Range Status  10/13/2022 73 mg/dL (calc) Final    Comment:    Reference range: <100 . Desirable range <100 mg/dL for primary prevention;   <70 mg/dL for patients with CHD or diabetic patients  with > or = 2 CHD risk factors. Marland Kitchen LDL-C is now calculated using the Martin-Hopkins  calculation, which is a validated novel method providing  better accuracy than the Friedewald equation in the  estimation of LDL-C.  Horald Pollen et al. Lenox Ahr. 1610;960(45): 2061-2068  (http://education.[facility].com/faq/FAQ164)    HDL  Date Value Ref Range Status  10/13/2022 54 > OR = 50 mg/dL Final   Triglycerides  Date Value Ref Range Status  10/13/2022 94 <150 mg/dL Final         Passed - Patient is not pregnant

## 2023-02-04 ENCOUNTER — Other Ambulatory Visit: Payer: Self-pay | Admitting: Family Medicine

## 2023-02-04 DIAGNOSIS — G5682 Other specified mononeuropathies of left upper limb: Secondary | ICD-10-CM

## 2023-02-06 NOTE — Telephone Encounter (Signed)
Requested medications are due for refill today.  yes  Requested medications are on the active medications list.  yes  Last refill. 11/09/2022 #30 2 rf  Future visit scheduled.   no  Notes to clinic.  Refill not delegated.    Requested Prescriptions  Pending Prescriptions Disp Refills   cyclobenzaprine (FLEXERIL) 10 MG tablet [Pharmacy Med Name: CYCLOBENZAPRINE 10MG  TABLETS] 30 tablet 2    Sig: TAKE 1 TABLET(10 MG) BY MOUTH THREE TIMES DAILY AS NEEDED FOR MUSCLE SPASMS     Not Delegated - Analgesics:  Muscle Relaxants Failed - 02/04/2023 10:36 PM      Failed - This refill cannot be delegated      Failed - Valid encounter within last 6 months    Recent Outpatient Visits           1 year ago Benign essential HTN   [facility] Donita Brooks, MD   2 years ago Benign essential HTN   [facility] Donita Brooks, MD   3 years ago Fever, unspecified fever cause   [facility] Medicine Donita Brooks, MD   3 years ago Benign essential HTN   [facility] Tanya Nones, Priscille Heidelberg, MD   4 years ago Pinched nerve in shoulder, left   [facility] Medicine [place], Velna Hatchet, MD

## 2023-02-17 ENCOUNTER — Other Ambulatory Visit: Payer: Self-pay | Admitting: Family Medicine

## 2023-02-17 DIAGNOSIS — G5682 Other specified mononeuropathies of left upper limb: Secondary | ICD-10-CM

## 2023-02-19 NOTE — Telephone Encounter (Signed)
Requested medication (s) are due for refill today - yes  Requested medication (s) are on the active medication list -yes  Future visit scheduled -no  Last refill: 11/09/22 #30 2RF  Notes to clinic: non delegated Rx  Requested Prescriptions  Pending Prescriptions Disp Refills   cyclobenzaprine (FLEXERIL) 10 MG tablet [Pharmacy Med Name: CYCLOBENZAPRINE 10MG  TABLETS] 30 tablet 2    Sig: TAKE 1 TABLET(10 MG) BY MOUTH THREE TIMES DAILY AS NEEDED FOR MUSCLE SPASMS     Not Delegated - Analgesics:  Muscle Relaxants Failed - 02/17/2023  9:12 AM      Failed - This refill cannot be delegated      Failed - Valid encounter within last 6 months    Recent Outpatient Visits           1 year ago Benign essential HTN   [facility] Donita Brooks, MD   2 years ago Benign essential HTN   [facility] Tanya Nones, Priscille Heidelberg, MD   3 years ago Fever, unspecified fever cause   [facility] Tanya Nones, Priscille Heidelberg, MD   3 years ago Benign essential HTN   [facility] Tanya Nones, Priscille Heidelberg, MD   4 years ago Pinched nerve in shoulder, left   [facility] Medicine [place], Velna Hatchet, MD              Refused Prescriptions Disp Refills   potassium chloride SA (KLOR-CON M) 20 MEQ tablet [Pharmacy Med Name: POTASSIUM CL ER TABLETS] 180 tablet 0    Sig: TAKE 2 TABLETS BY MOUTH DAILY     Endocrinology:  Minerals - Potassium Supplementation Failed - 02/17/2023  9:12 AM      Failed - Valid encounter within last 12 months    Recent Outpatient Visits           1 year ago Benign essential HTN   [facility] Tanya Nones, Priscille Heidelberg, MD   2 years ago Benign essential HTN   [facility] Donita Brooks, MD   3 years ago Fever, unspecified fever cause   [facility] Donita Brooks, MD   3 years ago Benign essential HTN   [facility] Donita Brooks, MD   4 years ago Pinched  nerve in shoulder, left   [facility] [place], Velna Hatchet, MD              Passed - K in normal range and within 360 days    Potassium  Date Value Ref Range Status  10/13/2022 4.4 3.5 - 5.3 mmol/L Final  11/14/2016 3.6 3.5 - 5.1 mEq/L Final         Passed - Cr in normal range and within 360 days    Creatinine  Date Value Ref Range Status  11/14/2016 0.9 0.6 - 1.1 mg/dL Final   Creat  Date Value Ref Range Status  10/13/2022 0.84 0.50 - 1.03 mg/dL Final            Requested Prescriptions  Pending Prescriptions Disp Refills   cyclobenzaprine (FLEXERIL) 10 MG tablet [Pharmacy Med Name: CYCLOBENZAPRINE 10MG  TABLETS] 30 tablet 2    Sig: TAKE 1 TABLET(10 MG) BY MOUTH THREE TIMES DAILY AS NEEDED FOR MUSCLE SPASMS     Not Delegated - Analgesics:  Muscle Relaxants Failed - 02/17/2023  9:12 AM      Failed - This refill cannot be delegated  Failed - Valid encounter within last 6 months    Recent Outpatient Visits           1 year ago Benign essential HTN   [facility] Tanya Nones, Priscille Heidelberg, MD   2 years ago Benign essential HTN   [facility] Tanya Nones, Priscille Heidelberg, MD   3 years ago Fever, unspecified fever cause   [facility] Tanya Nones, Priscille Heidelberg, MD   3 years ago Benign essential HTN   [facility] Tanya Nones, Priscille Heidelberg, MD   4 years ago Pinched nerve in shoulder, left   [facility] [place], Velna Hatchet, MD              Refused Prescriptions Disp Refills   potassium chloride SA (KLOR-CON M) 20 MEQ tablet [Pharmacy Med Name: POTASSIUM CL ER TABLETS] 180 tablet 0    Sig: TAKE 2 TABLETS BY MOUTH DAILY     Endocrinology:  Minerals - Potassium Supplementation Failed - 02/17/2023  9:12 AM      Failed - Valid encounter within last 12 months    Recent Outpatient Visits           1 year ago Benign essential HTN   [facility] Donita Brooks, MD   2 years ago  Benign essential HTN   [facility] Donita Brooks, MD   3 years ago Fever, unspecified fever cause   [facility] Donita Brooks, MD   3 years ago Benign essential HTN   [facility] Donita Brooks, MD   4 years ago Pinched nerve in shoulder, left   [facility] Medicine [place], Velna Hatchet, MD              Passed - K in normal range and within 360 days    Potassium  Date Value Ref Range Status  10/13/2022 4.4 3.5 - 5.3 mmol/L Final  11/14/2016 3.6 3.5 - 5.1 mEq/L Final         Passed - Cr in normal range and within 360 days    Creatinine  Date Value Ref Range Status  11/14/2016 0.9 0.6 - 1.1 mg/dL Final   Creat  Date Value Ref Range Status  10/13/2022 0.84 0.50 - 1.03 mg/dL Final

## 2023-02-19 NOTE — Telephone Encounter (Signed)
Rx 01/02/23 #180- too soom Requested Prescriptions  Pending Prescriptions Disp Refills   cyclobenzaprine (FLEXERIL) 10 MG tablet [Pharmacy Med Name: CYCLOBENZAPRINE 10MG  TABLETS] 30 tablet 2    Sig: TAKE 1 TABLET(10 MG) BY MOUTH THREE TIMES DAILY AS NEEDED FOR MUSCLE SPASMS     Not Delegated - Analgesics:  Muscle Relaxants Failed - 02/17/2023  9:12 AM      Failed - This refill cannot be delegated      Failed - Valid encounter within last 6 months    Recent Outpatient Visits           1 year ago Benign essential HTN   [facility] Donita Brooks, MD   2 years ago Benign essential HTN   [facility] Donita Brooks, MD   3 years ago Fever, unspecified fever cause   [facility] Tanya Nones, Priscille Heidelberg, MD   3 years ago Benign essential HTN   [facility] Tanya Nones, Priscille Heidelberg, MD   4 years ago Pinched nerve in shoulder, left   [facility] Medicine [place], Velna Hatchet, MD               potassium chloride SA (KLOR-CON M) 20 MEQ tablet [Pharmacy Med Name: POTASSIUM CL ER TABLETS] 180 tablet 0    Sig: TAKE 2 TABLETS BY MOUTH DAILY     Endocrinology:  Minerals - Potassium Supplementation Failed - 02/17/2023  9:12 AM      Failed - Valid encounter within last 12 months    Recent Outpatient Visits           1 year ago Benign essential HTN   [facility] Donita Brooks, MD   2 years ago Benign essential HTN   [facility] Donita Brooks, MD   3 years ago Fever, unspecified fever cause   [facility] Donita Brooks, MD   3 years ago Benign essential HTN   [facility] [place], Priscille Heidelberg, MD   4 years ago Pinched nerve in shoulder, left   [facility] Medicine [place], Velna Hatchet, MD              Passed - K in normal range and within 360 days    Potassium  Date Value Ref Range Status  10/13/2022 4.4 3.5 - 5.3 mmol/L Final   11/14/2016 3.6 3.5 - 5.1 mEq/L Final         Passed - Cr in normal range and within 360 days    Creatinine  Date Value Ref Range Status  11/14/2016 0.9 0.6 - 1.1 mg/dL Final   Creat  Date Value Ref Range Status  10/13/2022 0.84 0.50 - 1.03 mg/dL Final

## 2023-03-15 ENCOUNTER — Ambulatory Visit (INDEPENDENT_AMBULATORY_CARE_PROVIDER_SITE_OTHER): Payer: BC Managed Care – PPO | Admitting: Family Medicine

## 2023-03-15 ENCOUNTER — Encounter: Payer: Self-pay | Admitting: Family Medicine

## 2023-03-15 VITALS — BP 122/76 | HR 65 | Temp 97.9°F | Ht 61.0 in | Wt 201.8 lb

## 2023-03-15 DIAGNOSIS — I10 Essential (primary) hypertension: Secondary | ICD-10-CM

## 2023-03-15 DIAGNOSIS — G5682 Other specified mononeuropathies of left upper limb: Secondary | ICD-10-CM | POA: Diagnosis not present

## 2023-03-15 DIAGNOSIS — E559 Vitamin D deficiency, unspecified: Secondary | ICD-10-CM | POA: Diagnosis not present

## 2023-03-15 DIAGNOSIS — E785 Hyperlipidemia, unspecified: Secondary | ICD-10-CM

## 2023-03-15 MED ORDER — CYCLOBENZAPRINE HCL 10 MG PO TABS: 10.0000 mg | ORAL_TABLET | Freq: Three times a day (TID) | ORAL | 2 refills | Status: DC | PRN

## 2023-03-15 MED ORDER — POTASSIUM CHLORIDE CRYS ER 20 MEQ PO TBCR: 40.0000 meq | EXTENDED_RELEASE_TABLET | Freq: Every day | ORAL | 3 refills | Status: DC

## 2023-03-15 MED ORDER — ZOLPIDEM TARTRATE 10 MG PO TABS: 10.0000 mg | ORAL_TABLET | Freq: Every evening | ORAL | 3 refills | Status: DC | PRN

## 2023-03-15 MED ORDER — PRAVASTATIN SODIUM 40 MG PO TABS: 40.0000 mg | ORAL_TABLET | Freq: Every day | ORAL | 3 refills | Status: DC

## 2023-03-15 MED ORDER — LOSARTAN POTASSIUM-HCTZ 100-12.5 MG PO TABS: 1.0000 | ORAL_TABLET | Freq: Every day | ORAL | 3 refills | Status: DC

## 2023-03-15 NOTE — Progress Notes (Signed)
Subjective:    Patient ID: [name], female    DOB: Jun 13, 1965, 57 y.o.   MRN: [id number]  Patient is here today requesting a refill on her medication.  She has a history of hypertension.  She also has a history of hyperlipidemia and hypokalemia.  She is currently on potassium coupled with Hyzaar and pravastatin.  She denies any chest pain, shortness of breath, dyspnea on exertion.  She denies any myalgias or right upper quadrant pain.  Her last lab work was concerning for fatty liver disease.  We discussed this in detail today and I recommended low-fat diet rich in fruits and vegetables and exercise and weight loss to help address this. Past Medical History:  Diagnosis Date  . Breast cancer (HCC) 12/2005   Right breast  . Cancer (HCC) 02/2007   Renal cell carcinoma - Left kidney  . Fibromyalgia   . Hypertension   . Insomnia   . Ruptured disc, cervical    Past Surgical History:  Procedure Laterality Date  . CERVICAL DISC SURGERY    . CESAREAN SECTION     C-section x 3  . KNEE ARTHROSCOPY Right 1998  . MASTECTOMY Right 02/02/2006  . PARTIAL NEPHRECTOMY Left 06/11/2006  . PORT-A-CATH REMOVAL    . SALPINGOOPHORECTOMY     No current outpatient medications on file prior to visit.   No current facility-administered medications on file prior to visit.   Allergies  Allergen Reactions  . Codeine Nausea And Vomiting   Social History   Socioeconomic History  . Marital status: Married    Spouse name: Not on file  . Number of children: Not on file  . Years of education: Not on file  . Highest education level: Not on file  Occupational History  . Not on file  Tobacco Use  . Smoking status: Former    Types: Cigarettes  . Smokeless tobacco: Never  . Tobacco comments:    Quit over 20 years ago  Substance and Sexual Activity  . Alcohol use: No  . Drug use: No  . Sexual activity: Yes    Birth control/protection: Surgical  Other Topics Concern  . Not on file  Social  History Narrative  . Not on file   Social Determinants of Health   Financial Resource Strain: Not on file  Food Insecurity: Not on file  Transportation Needs: Not on file  Physical Activity: Not on file  Stress: Not on file  Social Connections: Not on file  Intimate Partner Violence: Not on file     Review of Systems  All other systems reviewed and are negative.      Objective:   Physical Exam Vitals reviewed.  Constitutional:      Appearance: She is well-developed.  Neck:     Vascular: No carotid bruit.  Cardiovascular:     Rate and Rhythm: Normal rate and regular rhythm.     Heart sounds: Normal heart sounds. No murmur heard.    No friction rub. No gallop.  Pulmonary:     Effort: Pulmonary effort is normal. No respiratory distress.     Breath sounds: Normal breath sounds. No wheezing or rales.  Abdominal:     General: Bowel sounds are normal.     Palpations: Abdomen is soft.  Musculoskeletal:     Cervical back: Neck supple. No rigidity.     Right lower leg: No edema.     Left lower leg: No edema.  Lymphadenopathy:     Cervical: No  cervical adenopathy.          Assessment & Plan:  Hyperlipidemia, unspecified hyperlipidemia type - Plan: CBC with Differential/Platelet, COMPLETE METABOLIC PANEL WITH GFR, Lipid panel  Pinched nerve in shoulder, left - Pain more consistent with pinched nerve in shoulder, some MSK pain in neck, already improved, given medrol dosepak, D/C NSAID - Plan: cyclobenzaprine (FLEXERIL) 10 MG tablet  Vitamin D deficiency - Plan: zolpidem (AMBIEN) 10 MG tablet  Benign essential HTN - Plan: losartan-hydrochlorothiazide (HYZAAR) 100-12.5 MG tablet I have very happy with her blood pressure.  Check CBC CMP and a lipid panel.  Would like LDL cholesterol under 100.  Recommended a low-fat diet rich in fruits and vegetables exercise, and weight loss to address fatty liver disease.  Refilled her Ambien which she uses nightly for insomnia.  Also  refilled her Flexeril that she uses for neck pain and back pain.

## 2023-03-20 LAB — COMPLETE METABOLIC PANEL WITH GFR
AG Ratio: 1.3 (calc) (ref 1.0–2.5)
ALT: 58 U/L — ABNORMAL HIGH (ref 6–29)
AST: 94 U/L — ABNORMAL HIGH (ref 10–35)
Albumin: 4.4 g/dL (ref 3.6–5.1)
Alkaline phosphatase (APISO): 100 U/L (ref 37–153)
BUN: 15 mg/dL (ref 7–25)
CO2: 24 mmol/L (ref 20–32)
Calcium: 9.8 mg/dL (ref 8.6–10.4)
Chloride: 102 mmol/L (ref 98–110)
Creat: 0.84 mg/dL (ref 0.50–1.03)
Globulin: 3.3 g/dL (ref 1.9–3.7)
Glucose, Bld: 123 mg/dL — ABNORMAL HIGH (ref 65–99)
Potassium: 3.8 mmol/L (ref 3.5–5.3)
Sodium: 136 mmol/L (ref 135–146)
Total Bilirubin: 0.9 mg/dL (ref 0.2–1.2)
Total Protein: 7.7 g/dL (ref 6.1–8.1)
eGFR: 80 mL/min/{1.73_m2} (ref >=60)

## 2023-03-20 LAB — HEMOGLOBIN A1C
Hgb A1c MFr Bld: 6.8 %{Hb} — ABNORMAL HIGH (ref <5.7)
Mean Plasma Glucose: 148 mg/dL
eAG (mmol/L): 8.2 mmol/L

## 2023-03-20 LAB — CBC WITH DIFFERENTIAL/PLATELET
Absolute Lymphocytes: 2486 {cells}/uL (ref 850–3900)
Absolute Monocytes: 806 {cells}/uL (ref 200–950)
Basophils Absolute: 67 {cells}/uL (ref 0–200)
Basophils Relative: 0.7 %
Eosinophils Absolute: 240 {cells}/uL (ref 15–500)
Eosinophils Relative: 2.5 %
HCT: 44.2 % (ref 35.0–45.0)
Hemoglobin: 14.6 g/dL (ref 11.7–15.5)
MCH: 30.6 pg (ref 27.0–33.0)
MCHC: 33 g/dL (ref 32.0–36.0)
MCV: 92.7 fL (ref 80.0–100.0)
MPV: 10.7 fL (ref 7.5–12.5)
Monocytes Relative: 8.4 %
Neutro Abs: 6000 {cells}/uL (ref 1500–7800)
Neutrophils Relative %: 62.5 %
Platelets: 331 10*3/uL (ref 140–400)
RBC: 4.77 10*6/uL (ref 3.80–5.10)
RDW: 12.9 % (ref 11.0–15.0)
Total Lymphocyte: 25.9 %
WBC: 9.6 10*3/uL (ref 3.8–10.8)

## 2023-03-20 LAB — LIPID PANEL
Cholesterol: 163 mg/dL (ref <200)
HDL: 59 mg/dL (ref >=50)
LDL Cholesterol (Calc): 84 mg/dL
Non-HDL Cholesterol (Calc): 104 mg/dL (ref <130)
Total CHOL/HDL Ratio: 2.8 (calc) (ref <5.0)
Triglycerides: 100 mg/dL (ref <150)

## 2023-03-20 LAB — TEST AUTHORIZATION

## 2023-03-23 ENCOUNTER — Other Ambulatory Visit: Payer: Self-pay

## 2023-03-23 DIAGNOSIS — E119 Type 2 diabetes mellitus without complications: Secondary | ICD-10-CM

## 2023-03-23 MED ORDER — METFORMIN HCL ER 500 MG PO TB24: 1000.0000 mg | ORAL_TABLET | Freq: Every day | ORAL | 1 refills | Status: DC

## 2023-06-06 ENCOUNTER — Other Ambulatory Visit: Payer: Self-pay | Admitting: Family Medicine

## 2023-06-06 DIAGNOSIS — G5682 Other specified mononeuropathies of left upper limb: Secondary | ICD-10-CM

## 2023-06-07 NOTE — Telephone Encounter (Signed)
Requested medication (s) are due for refill today: yes  Requested medication (s) are on the active medication list: yes  Last refill:  03/15/23 #30 2 RF  Future visit scheduled: yes  Notes to clinic:  med not delegated to NT to RF   Requested Prescriptions  Pending Prescriptions Disp Refills   cyclobenzaprine (FLEXERIL) 10 MG tablet [Pharmacy Med Name: CYCLOBENZAPRINE 10MG  TABLETS] 30 tablet 2    Sig: TAKE 1 TABLET(10 MG) BY MOUTH THREE TIMES DAILY AS NEEDED FOR MUSCLE SPASMS     Not Delegated - Analgesics:  Muscle Relaxants Failed - 06/07/2023  8:43 AM      Failed - This refill cannot be delegated      Failed - Valid encounter within last 6 months    Recent Outpatient Visits           1 year ago Benign essential HTN   [facility] Donita Brooks, MD   3 years ago Benign essential HTN   [facility] Donita Brooks, MD   3 years ago Fever, unspecified fever cause   [facility] Donita Brooks, MD   4 years ago Benign essential HTN   [facility] Tanya Nones, Priscille Heidelberg, MD   4 years ago Pinched nerve in shoulder, left   [facility] Medicine [place], Velna Hatchet, MD       Future Appointments             In 3 months [place], Priscille Heidelberg, MD [facility], [facility]

## 2023-06-08 ENCOUNTER — Other Ambulatory Visit: Payer: Self-pay | Admitting: Family Medicine

## 2023-06-08 DIAGNOSIS — G5682 Other specified mononeuropathies of left upper limb: Secondary | ICD-10-CM

## 2023-06-08 MED ORDER — CYCLOBENZAPRINE HCL 10 MG PO TABS: 10.0000 mg | ORAL_TABLET | Freq: Three times a day (TID) | ORAL | 2 refills | Status: DC | PRN

## 2023-06-23 DIAGNOSIS — Z20828 Contact with and (suspected) exposure to other viral communicable diseases: Secondary | ICD-10-CM | POA: Diagnosis not present

## 2023-06-23 DIAGNOSIS — B349 Viral infection, unspecified: Secondary | ICD-10-CM | POA: Diagnosis not present

## 2023-06-23 DIAGNOSIS — R509 Fever, unspecified: Secondary | ICD-10-CM | POA: Diagnosis not present

## 2023-06-23 DIAGNOSIS — R0981 Nasal congestion: Secondary | ICD-10-CM | POA: Diagnosis not present

## 2023-06-23 DIAGNOSIS — Z20822 Contact with and (suspected) exposure to covid-19: Secondary | ICD-10-CM | POA: Diagnosis not present

## 2023-07-04 ENCOUNTER — Other Ambulatory Visit: Payer: Self-pay | Admitting: Family Medicine

## 2023-07-04 DIAGNOSIS — I10 Essential (primary) hypertension: Secondary | ICD-10-CM

## 2023-07-05 ENCOUNTER — Other Ambulatory Visit: Payer: Self-pay | Admitting: Family Medicine

## 2023-07-05 DIAGNOSIS — E559 Vitamin D deficiency, unspecified: Secondary | ICD-10-CM

## 2023-08-30 ENCOUNTER — Other Ambulatory Visit: Payer: Self-pay | Admitting: Family Medicine

## 2023-08-30 DIAGNOSIS — G5682 Other specified mononeuropathies of left upper limb: Secondary | ICD-10-CM

## 2023-08-31 NOTE — Telephone Encounter (Signed)
 Requested medication (s) are due for refill today - yes  Requested medication (s) are on the active medication list -yes  Future visit scheduled -yes  Last refill: 06/08/23 #30 2RF  Notes to clinic: non delegated Rx  Requested Prescriptions  Pending Prescriptions Disp Refills   cyclobenzaprine  (FLEXERIL ) 10 MG tablet [Pharmacy Med Name: CYCLOBENZAPRINE  10MG  TABLETS] 30 tablet 2    Sig: TAKE 1 TABLET(10 MG) BY MOUTH THREE TIMES DAILY AS NEEDED FOR MUSCLE SPASMS     Not Delegated - Analgesics:  Muscle Relaxants Failed - 08/31/2023  8:53 AM      Failed - This refill cannot be delegated      Passed - Valid encounter within last 6 months    Recent Outpatient Visits           5 months ago Hyperlipidemia, unspecified hyperlipidemia type   [facility] Austine Lefort, MD   10 months ago Hyperlipidemia, unspecified hyperlipidemia type   [place] [facility] [place], Cisco Crest, MD   1 year ago Benign essential HTN   [facility] Medicine Pickard, Cisco Crest, MD       Future Appointments             In 1 week Pickard, Cisco Crest, MD [facility], [facility]               Requested Prescriptions  Pending Prescriptions Disp Refills   cyclobenzaprine  (FLEXERIL ) 10 MG tablet [Pharmacy Med Name: CYCLOBENZAPRINE  10MG  TABLETS] 30 tablet 2    Sig: TAKE 1 TABLET(10 MG) BY MOUTH THREE TIMES DAILY AS NEEDED FOR MUSCLE SPASMS     Not Delegated - Analgesics:  Muscle Relaxants Failed - 08/31/2023  8:53 AM      Failed - This refill cannot be delegated      Passed - Valid encounter within last 6 months    Recent Outpatient Visits           5 months ago Hyperlipidemia, unspecified hyperlipidemia type   [facility] Austine Lefort, MD   10 months ago Hyperlipidemia, unspecified hyperlipidemia type   [facility] Austine Lefort, MD   1 year  ago Benign essential HTN   [facility] Medicine [place], Cisco Crest, MD       Future Appointments             In 1 week Pickard, Cisco Crest, MD [facility], [facility]

## 2023-09-13 ENCOUNTER — Ambulatory Visit: Payer: BC Managed Care – PPO | Admitting: Family Medicine

## 2023-09-16 ENCOUNTER — Other Ambulatory Visit: Payer: Self-pay | Admitting: Family Medicine

## 2023-09-16 DIAGNOSIS — E119 Type 2 diabetes mellitus without complications: Secondary | ICD-10-CM

## 2023-09-18 NOTE — Telephone Encounter (Signed)
 Requested Prescriptions  Pending Prescriptions Disp Refills   metFORMIN  (GLUCOPHAGE -XR) 500 MG 24 hr tablet [Pharmacy Med Name: METFORMIN  ER 500MG  24HR TABS] 180 tablet 0    Sig: TAKE 2 TABLETS(1000 MG) BY MOUTH DAILY WITH BREAKFAST     Endocrinology:  Diabetes - Biguanides Failed - 09/18/2023 12:30 PM      Failed - HBA1C is between 0 and 7.9 and within 180 days    Hgb A1c MFr Bld  Date Value Ref Range Status  03/15/2023 6.8 (H) <5.7 % of total Hgb Final    Comment:    For someone without known diabetes, a hemoglobin A1c value of 6.5% or greater indicates that they may have  diabetes and this should be confirmed with a follow-up  test. . For someone with known diabetes, a value <7% indicates  that their diabetes is well controlled and a value  greater than or equal to 7% indicates suboptimal  control. A1c targets should be individualized based on  duration of diabetes, age, comorbid conditions, and  other considerations. . Currently, no consensus exists regarding use of hemoglobin A1c for diagnosis of diabetes for children. .          Failed - B12 Level in normal range and within 720 days    No results found for: "VITAMINB12"       Failed - Valid encounter within last 6 months    Recent Outpatient Visits           6 months ago Hyperlipidemia, unspecified hyperlipidemia type   [facility] Austine Lefort, MD   11 months ago Hyperlipidemia, unspecified hyperlipidemia type   [facility] Austine Lefort, MD   1 year ago Benign essential HTN   [facility] Medicine [place], Cisco Crest, MD              Passed - Cr in normal range and within 360 days    Creatinine  Date Value Ref Range Status  11/14/2016 0.9 0.6 - 1.1 mg/dL Final   Creat  Date Value Ref Range Status  03/15/2023 0.84 0.50 - 1.03 mg/dL Final         Passed - eGFR in normal range and within 360 days    GFR, Est African  American  Date Value Ref Range Status  04/23/2020 101 > OR = 60 mL/min/1.80m2 Final   GFR, Est Non African American  Date Value Ref Range Status  04/23/2020 87 > OR = 60 mL/min/1.40m2 Final   eGFR  Date Value Ref Range Status  03/15/2023 80 > OR = 60 mL/min/1.37m2 Final         Passed - CBC within normal limits and completed in the last 12 months    WBC  Date Value Ref Range Status  03/15/2023 9.6 3.8 - 10.8 Thousand/uL Final   RBC  Date Value Ref Range Status  03/15/2023 4.77 3.80 - 5.10 Million/uL Final   Hemoglobin  Date Value Ref Range Status  03/15/2023 14.6 11.7 - 15.5 g/dL Final   HGB  Date Value Ref Range Status  11/14/2016 14.3 11.6 - 15.9 g/dL Final   HCT  Date Value Ref Range Status  03/15/2023 44.2 35.0 - 45.0 % Final  11/14/2016 41.8 34.8 - 46.6 % Final   MCHC  Date Value Ref Range Status  03/15/2023 33.0 32.0 - 36.0 g/dL Final    Comment:    For adults, a slight decrease in the  calculated MCHC value (in the range of 30 to 32 g/dL) is most likely not clinically significant; however, it should be interpreted with caution in correlation with other red cell parameters and the patient's clinical condition.    [facility]  Date Value Ref Range Status  03/15/2023 30.6 27.0 - 33.0 pg Final   MCV  Date Value Ref Range Status  03/15/2023 92.7 80.0 - 100.0 fL Final  11/14/2016 87.1 79.5 - 101.0 fL Final   No results found for: "PLTCOUNTKUC", "LABPLAT", "POCPLA" RDW  Date Value Ref Range Status  03/15/2023 12.9 11.0 - 15.0 % Final  11/14/2016 13.7 11.2 - 14.5 % Final

## 2023-09-20 ENCOUNTER — Ambulatory Visit: Admitting: Family Medicine

## 2023-09-20 ENCOUNTER — Encounter: Payer: Self-pay | Admitting: Family Medicine

## 2023-09-20 VITALS — BP 124/86 | HR 95 | Temp 98.4°F | Ht 61.0 in | Wt 189.0 lb

## 2023-09-20 DIAGNOSIS — I10 Essential (primary) hypertension: Secondary | ICD-10-CM

## 2023-09-20 DIAGNOSIS — E785 Hyperlipidemia, unspecified: Secondary | ICD-10-CM

## 2023-09-20 DIAGNOSIS — Z124 Encounter for screening for malignant neoplasm of cervix: Secondary | ICD-10-CM | POA: Diagnosis not present

## 2023-09-20 DIAGNOSIS — E118 Type 2 diabetes mellitus with unspecified complications: Secondary | ICD-10-CM | POA: Diagnosis not present

## 2023-09-20 DIAGNOSIS — Z7984 Long term (current) use of oral hypoglycemic drugs: Secondary | ICD-10-CM

## 2023-09-20 NOTE — Progress Notes (Signed)
 Subjective:    Patient ID: [name], female    DOB: [birth year], 58 y.o.   MRN: [id number]  Patient has a history of hypertension.  She also has a history of hyperlipidemia and hypokalemia.  She is currently on potassium coupled with Hyzaar and pravastatin .  She denies any chest pain, shortness of breath, dyspnea on exertion.  She denies any myalgias or right upper quadrant pain.  At her last office visit she was found to have type 2 diabetes.  She did not try the injection as it would allow her to use her provide liver disease but instead try metformin  Past Medical History:  Diagnosis Date   Breast cancer (HCC) 12/2005   Right breast   Cancer (HCC) 02/2007   Renal cell carcinoma - Left kidney   Fibromyalgia    Hypertension    Insomnia    Ruptured disc, cervical    Past Surgical History:  Procedure Laterality Date   CERVICAL DISC SURGERY     CESAREAN SECTION     C-section x 3   KNEE ARTHROSCOPY Right 1998   MASTECTOMY Right 02/02/2006   PARTIAL NEPHRECTOMY Left 06/11/2006   PORT-A-CATH REMOVAL     SALPINGOOPHORECTOMY     Current Outpatient Medications on File Prior to Visit  Medication Sig Dispense Refill   cyclobenzaprine  (FLEXERIL ) 10 MG tablet TAKE 1 TABLET(10 MG) BY MOUTH THREE TIMES DAILY AS NEEDED FOR MUSCLE SPASMS 30 tablet 2   losartan -hydrochlorothiazide  (HYZAAR) 100-12.5 MG tablet TAKE 1 TABLET BY MOUTH DAILY 90 tablet 3   metFORMIN  (GLUCOPHAGE -XR) 500 MG 24 hr tablet TAKE 2 TABLETS(1000 MG) BY MOUTH DAILY WITH BREAKFAST 180 tablet 0   potassium chloride  SA (KLOR-CON  M) 20 MEQ tablet Take 2 tablets (40 mEq total) by mouth daily. 180 tablet 3   pravastatin  (PRAVACHOL ) 40 MG tablet Take 1 tablet (40 mg total) by mouth daily. 90 tablet 3   zolpidem  (AMBIEN ) 10 MG tablet TAKE 1 TABLET(10 MG) BY MOUTH AT BEDTIME AS NEEDED FOR SLEEP 30 tablet 5   No current facility-administered medications on file prior to visit.   Allergies  Allergen Reactions   Codeine Nausea And  Vomiting   Social History   Socioeconomic History   Marital status: Married    Spouse name: Not on file   Number of children: Not on file   Years of education: Not on file   Highest education level: Not on file  Occupational History   Not on file  Tobacco Use   Smoking status: Former    Types: Cigarettes   Smokeless tobacco: Never   Tobacco comments:    Quit over 20 years ago  Substance and Sexual Activity   Alcohol use: No   Drug use: No   Sexual activity: Yes    Birth control/protection: Surgical  Other Topics Concern   Not on file  Social History Narrative   Not on file   Social Drivers of Health   Financial Resource Strain: Not on file  Food Insecurity: Not on file  Transportation Needs: Not on file  Physical Activity: Not on file  Stress: Not on file  Social Connections: Not on file  Intimate Partner Violence: Not on file     Review of Systems  All other systems reviewed and are negative.      Objective:   Physical Exam Vitals reviewed.  Constitutional:      Appearance: She is well-developed.  Neck:     Vascular: No carotid bruit.  Cardiovascular:  Rate and Rhythm: Normal rate and regular rhythm.     Heart sounds: Normal heart sounds. No murmur heard.    No friction rub. No gallop.  Pulmonary:     Effort: Pulmonary effort is normal. No respiratory distress.     Breath sounds: Normal breath sounds. No wheezing or rales.  Abdominal:     General: Bowel sounds are normal.     Palpations: Abdomen is soft.  Musculoskeletal:     Cervical back: Neck supple. No rigidity.     Right lower leg: No edema.     Left lower leg: No edema.  Lymphadenopathy:     Cervical: No cervical adenopathy.           Assessment & Plan:  Hyperlipidemia, unspecified hyperlipidemia type  Controlled type 2 diabetes mellitus with complication, without long-term current use of insulin (HCC) - Plan: Hemoglobin A1c, CBC with Differential/Platelet, COMPLETE METABOLIC  PANEL WITHOUT GFR, Lipid panel, Microalbumin/Creatinine Ratio, Urine  Benign essential HTN  Cervical cancer screening Ideally I like to see her liver function test normalized and her A1c less than 6.5.  If her liver tests are still elevated I will temporarily hold pravastatin  and then repeat liver function test.  If still elevated at that point I would suspect fatty liver disease and recommend switching metformin  to Ozempic/Rybelsus.  Consider right upper quadrant ultrasound to confirm diagnosis.  Blood pressure today is excellent.

## 2023-09-21 LAB — MICROALBUMIN / CREATININE URINE RATIO
Creatinine, Urine: 36 mg/dL (ref 20–275)
Microalb, Ur: <0.2 mg/dL

## 2023-09-21 LAB — COMPLETE METABOLIC PANEL WITHOUT GFR
AG Ratio: 1.4 (calc) (ref 1.0–2.5)
ALT: 33 U/L — ABNORMAL HIGH (ref 6–29)
AST: 38 U/L — ABNORMAL HIGH (ref 10–35)
Albumin: 4.3 g/dL (ref 3.6–5.1)
Alkaline phosphatase (APISO): 91 U/L (ref 37–153)
BUN: 13 mg/dL (ref 7–25)
CO2: 24 mmol/L (ref 20–32)
Calcium: 9.9 mg/dL (ref 8.6–10.4)
Chloride: 102 mmol/L (ref 98–110)
Creat: 0.76 mg/dL (ref 0.50–1.03)
Globulin: 3.1 g/dL (ref 1.9–3.7)
Glucose, Bld: 98 mg/dL (ref 65–99)
Potassium: 4.2 mmol/L (ref 3.5–5.3)
Sodium: 136 mmol/L (ref 135–146)
Total Bilirubin: 0.5 mg/dL (ref 0.2–1.2)
Total Protein: 7.4 g/dL (ref 6.1–8.1)

## 2023-09-21 LAB — CBC WITH DIFFERENTIAL/PLATELET
Absolute Lymphocytes: 2748 {cells}/uL (ref 850–3900)
Absolute Monocytes: 683 {cells}/uL (ref 200–950)
Basophils Absolute: 91 {cells}/uL (ref 0–200)
Basophils Relative: 1 %
Eosinophils Absolute: 182 {cells}/uL (ref 15–500)
Eosinophils Relative: 2 %
HCT: 43.2 % (ref 35.0–45.0)
Hemoglobin: 14.3 g/dL (ref 11.7–15.5)
MCH: 29.9 pg (ref 27.0–33.0)
MCHC: 33.1 g/dL (ref 32.0–36.0)
MCV: 90.4 fL (ref 80.0–100.0)
MPV: 10.1 fL (ref 7.5–12.5)
Monocytes Relative: 7.5 %
Neutro Abs: 5396 {cells}/uL (ref 1500–7800)
Neutrophils Relative %: 59.3 %
Platelets: 334 10*3/uL (ref 140–400)
RBC: 4.78 10*6/uL (ref 3.80–5.10)
RDW: 13 % (ref 11.0–15.0)
Total Lymphocyte: 30.2 %
WBC: 9.1 10*3/uL (ref 3.8–10.8)

## 2023-09-21 LAB — HEMOGLOBIN A1C
Hgb A1c MFr Bld: 6.3 % — ABNORMAL HIGH (ref <5.7)
Mean Plasma Glucose: 134 mg/dL
eAG (mmol/L): 7.4 mmol/L

## 2023-09-21 LAB — LIPID PANEL
Cholesterol: 147 mg/dL (ref <200)
HDL: 57 mg/dL (ref >=50)
LDL Cholesterol (Calc): 69 mg/dL
Non-HDL Cholesterol (Calc): 90 mg/dL (ref <130)
Total CHOL/HDL Ratio: 2.6 (calc) (ref <5.0)
Triglycerides: 119 mg/dL (ref <150)

## 2023-10-17 ENCOUNTER — Other Ambulatory Visit: Payer: Self-pay

## 2023-10-17 DIAGNOSIS — E785 Hyperlipidemia, unspecified: Secondary | ICD-10-CM

## 2023-10-17 DIAGNOSIS — E118 Type 2 diabetes mellitus with unspecified complications: Secondary | ICD-10-CM

## 2023-10-17 MED ORDER — RYBELSUS 3 MG PO TABS: 3.0000 mg | ORAL_TABLET | Freq: Every day | ORAL | 3 refills | Status: DC

## 2023-10-22 ENCOUNTER — Other Ambulatory Visit (HOSPITAL_COMMUNITY): Payer: Self-pay

## 2023-10-22 ENCOUNTER — Telehealth: Payer: Self-pay | Admitting: Pharmacy Technician

## 2023-10-22 NOTE — Telephone Encounter (Signed)
 Pharmacy Patient Advocate Encounter   Received notification from Onbase that prior authorization for Rybelsus  3MG  tablets is required/requested.   Insurance verification completed.   The patient is insured through [facility] .   Per test claim: PA required; PA submitted to above mentioned insurance via CoverMyMeds Key/confirmation #/EOC [id number] Status is pending

## 2023-10-23 ENCOUNTER — Other Ambulatory Visit (HOSPITAL_COMMUNITY): Payer: Self-pay

## 2023-10-23 NOTE — Telephone Encounter (Signed)
 Pharmacy Patient Advocate Encounter  Received notification from [facility] that Prior Authorization for Rybelsus  3MG  tablets has been APPROVED from 10/22/23 to 10/21/24   PA #/Case ID/Reference #: [id number]

## 2023-10-25 ENCOUNTER — Other Ambulatory Visit (HOSPITAL_COMMUNITY): Payer: Self-pay

## 2023-10-29 ENCOUNTER — Other Ambulatory Visit: Payer: Self-pay

## 2023-10-29 MED ORDER — PIOGLITAZONE HCL 30 MG PO TABS: 30.0000 mg | ORAL_TABLET | Freq: Every day | ORAL | 0 refills | Status: DC

## 2023-11-14 ENCOUNTER — Other Ambulatory Visit: Payer: Self-pay | Admitting: Family Medicine

## 2023-11-14 ENCOUNTER — Telehealth: Payer: Self-pay | Admitting: Pharmacy Technician

## 2023-11-14 ENCOUNTER — Other Ambulatory Visit (HOSPITAL_COMMUNITY): Payer: Self-pay

## 2023-11-14 MED ORDER — OZEMPIC (0.25 OR 0.5 MG/DOSE) 2 MG/3ML ~~LOC~~ SOPN: 0.2500 mg | PEN_INJECTOR | SUBCUTANEOUS | 3 refills | Status: AC

## 2023-11-14 NOTE — Telephone Encounter (Signed)
 Pharmacy Patient Advocate Encounter   Received notification from Onbase that prior authorization for Ozempic (0.25 or 0.5 MG/DOSE) 2MG /3ML pen-injectors is required/requested.   Insurance verification completed.   The patient is insured through [facility] .   Per test claim: PA required; PA submitted to above mentioned insurance via CoverMyMeds Key/confirmation #/EOC [id number] Status is pending

## 2023-11-15 ENCOUNTER — Other Ambulatory Visit (HOSPITAL_COMMUNITY): Payer: Self-pay

## 2023-11-15 NOTE — Telephone Encounter (Signed)
 Pharmacy Patient Advocate Encounter  Received notification from [facility] that Prior Authorization for Ozempic (0.25 or 0.5 MG/DOSE) 2MG /3ML pen-injectors has been Approved. The official letter approval letter will be loaded to pt's media tab as soon as it is provided.     Ran a test claim and pt's copay is $933.74     PA #/Case ID/Reference #: [id number]

## 2023-12-03 ENCOUNTER — Other Ambulatory Visit: Payer: Self-pay | Admitting: Family Medicine

## 2023-12-03 DIAGNOSIS — G5682 Other specified mononeuropathies of left upper limb: Secondary | ICD-10-CM

## 2023-12-05 NOTE — Telephone Encounter (Signed)
 Requested medication (s) are due for refill today: yes  Requested medication (s) are on the active medication list: yes  Last refill:  08/31/23  Future visit scheduled: no  Notes to clinic:  Unable to refill per protocol, cannot delegate.      Requested Prescriptions  Pending Prescriptions Disp Refills   cyclobenzaprine  (FLEXERIL ) 10 MG tablet [Pharmacy Med Name: CYCLOBENZAPRINE  10MG  TABLETS] 30 tablet 2    Sig: TAKE 1 TABLET(10 MG) BY MOUTH THREE TIMES DAILY AS NEEDED FOR MUSCLE SPASMS     Not Delegated - Analgesics:  Muscle Relaxants Failed - 12/05/2023  1:51 PM      Failed - This refill cannot be delegated      Passed - Valid encounter within last 6 months    Recent Outpatient Visits           2 months ago Hyperlipidemia, unspecified hyperlipidemia type   [place] [facility] Duanne Butler DASEN, MD   8 months ago Hyperlipidemia, unspecified hyperlipidemia type   [facility] Duanne Butler DASEN, MD   1 year ago Hyperlipidemia, unspecified hyperlipidemia type   [place] [facility] Duanne Butler DASEN, MD   2 years ago Benign essential HTN   [place] [facility] Pickard, Butler DASEN, MD

## 2023-12-06 ENCOUNTER — Other Ambulatory Visit: Payer: Self-pay | Admitting: Family Medicine

## 2023-12-06 DIAGNOSIS — G5682 Other specified mononeuropathies of left upper limb: Secondary | ICD-10-CM

## 2023-12-06 MED ORDER — CYCLOBENZAPRINE HCL 10 MG PO TABS: 10.0000 mg | ORAL_TABLET | Freq: Three times a day (TID) | ORAL | 2 refills | Status: DC | PRN

## 2023-12-16 ENCOUNTER — Other Ambulatory Visit: Payer: Self-pay | Admitting: Family Medicine

## 2023-12-16 DIAGNOSIS — E119 Type 2 diabetes mellitus without complications: Secondary | ICD-10-CM

## 2024-01-01 ENCOUNTER — Other Ambulatory Visit: Payer: Self-pay | Admitting: Family Medicine

## 2024-01-01 DIAGNOSIS — E559 Vitamin D deficiency, unspecified: Secondary | ICD-10-CM

## 2024-01-02 ENCOUNTER — Other Ambulatory Visit: Payer: Self-pay

## 2024-01-02 DIAGNOSIS — E559 Vitamin D deficiency, unspecified: Secondary | ICD-10-CM

## 2024-01-02 MED ORDER — ZOLPIDEM TARTRATE 10 MG PO TABS: 10.0000 mg | ORAL_TABLET | Freq: Every evening | ORAL | 0 refills | Status: DC | PRN

## 2024-01-02 NOTE — Telephone Encounter (Signed)
 Requested medication (s) are due for refill today: no  Requested medication (s) are on the active medication list: yes  Last refill:  01/02/24 #30  Future visit scheduled: no  Notes to clinic:  med not delegated to NT   Requested Prescriptions  Pending Prescriptions Disp Refills   zolpidem  (AMBIEN ) 10 MG tablet [Pharmacy Med Name: ZOLPIDEM  10MG  TABLETS] 30 tablet     Sig: TAKE 1 TABLET(10 MG) BY MOUTH AT BEDTIME AS NEEDED FOR SLEEP     Not Delegated - Psychiatry:  Anxiolytics/Hypnotics Failed - 01/02/2024  3:30 PM      Failed - This refill cannot be delegated      Failed - Urine Drug Screen completed in last 360 days      Passed - Valid encounter within last 6 months    Recent Outpatient Visits           3 months ago Hyperlipidemia, unspecified hyperlipidemia type   [place] [facility] Duanne Butler DASEN, MD   9 months ago Hyperlipidemia, unspecified hyperlipidemia type   [place] [facility] Duanne Butler DASEN, MD   1 year ago Hyperlipidemia, unspecified hyperlipidemia type   [place] [facility] Duanne Butler DASEN, MD   2 years ago Benign essential HTN   [place] [facility] Pickard, Butler DASEN, MD

## 2024-02-03 ENCOUNTER — Other Ambulatory Visit: Payer: Self-pay | Admitting: Family Medicine

## 2024-02-03 DIAGNOSIS — E559 Vitamin D deficiency, unspecified: Secondary | ICD-10-CM

## 2024-02-04 NOTE — Telephone Encounter (Signed)
 Requested medication (s) are due for refill today:   Provider to review for Ambien .  Actos  30 mg has been discontinued.  Requested medication (s) are on the active medication list:   Yes for Ambien ;    No for Actos   Future visit scheduled:   No.   LOV 09/20/2023   Last ordered: Actos  discontinued 11/14/2023.    Ambien  01/02/2024 #30, 0 refills  Non delegated refill    Requested Prescriptions  Pending Prescriptions Disp Refills   pioglitazone  (ACTOS ) 30 MG tablet [Pharmacy Med Name: PIOGLITAZONE  30MG  TABLETS] 90 tablet 0    Sig: TAKE 1 TABLET(30 MG) BY MOUTH DAILY     Endocrinology:  Diabetes - Glitazones - pioglitazone  Passed - 02/04/2024  3:17 PM      Passed - HBA1C is between 0 and 7.9 and within 180 days    Hgb A1c MFr Bld  Date Value Ref Range Status  09/20/2023 6.3 (H) <5.7 % Final    Comment:    For someone without known diabetes, a hemoglobin  A1c value between 5.7% and 6.4% is consistent with prediabetes and should be confirmed with a  follow-up test. . For someone with known diabetes, a value <7% indicates that their diabetes is well controlled. A1c targets should be individualized based on duration of diabetes, age, comorbid conditions, and other considerations. . This assay result is consistent with an increased risk of diabetes. . Currently, no consensus exists regarding use of hemoglobin A1c for diagnosis of diabetes for children. SABRA Amy - Valid encounter within last 6 months    Recent Outpatient Visits           4 months ago Hyperlipidemia, unspecified hyperlipidemia type   [place] [facility] Duanne, Butler DASEN, MD   10 months ago Hyperlipidemia, unspecified hyperlipidemia type   [place] [facility] Duanne, Butler DASEN, MD   1 year ago Hyperlipidemia, unspecified hyperlipidemia type   [place] [facility] Duanne Butler DASEN, MD   2 years ago Benign essential HTN   [place] [facility] Pickard, Butler DASEN, MD               zolpidem  (AMBIEN ) 10 MG tablet [Pharmacy Med Name: ZOLPIDEM  10MG  TABLETS] 30 tablet     Sig: TAKE 1 TABLET BY MOUTH EVERY DAY AT BEDTIME AS NEEDED SLEEP     Not Delegated - Psychiatry:  Anxiolytics/Hypnotics Failed - 02/04/2024  3:17 PM      Failed - This refill cannot be delegated      Failed - Urine Drug Screen completed in last 360 days      Passed - Valid encounter within last 6 months    Recent Outpatient Visits           4 months ago Hyperlipidemia, unspecified hyperlipidemia type   [place] [facility] Duanne Butler DASEN, MD   10 months ago Hyperlipidemia, unspecified hyperlipidemia type   [place] [facility] Duanne Butler DASEN, MD   1 year ago Hyperlipidemia, unspecified hyperlipidemia type   [place] [facility] Duanne Butler DASEN, MD   2 years ago Benign essential HTN   [place] [facility] Pickard, Butler DASEN, MD

## 2024-03-04 ENCOUNTER — Other Ambulatory Visit: Payer: Self-pay | Admitting: Family Medicine

## 2024-03-04 DIAGNOSIS — G5682 Other specified mononeuropathies of left upper limb: Secondary | ICD-10-CM

## 2024-03-06 NOTE — Telephone Encounter (Signed)
 Copied from CRM [id number]. Topic: Clinical - Prescription Issue >> Mar 06, 2024  9:54 AM Sophia H wrote: Reason for CRM: Patient is stating that she spoke with the pharmacy this AM and was advised they have not received any RX's for her flexeril . I show it was sent over and confirmed received on my end, please clarify. # [phone]   cyclobenzaprine  (FLEXERIL ) 10 MG tablet E-Prescribing Status: Receipt confirmed by pharmacy (03/04/2024  3:03 PM EDT)   [id number] - [place], [place] - [address] [facility] [address]

## 2024-03-14 ENCOUNTER — Other Ambulatory Visit: Payer: Self-pay | Admitting: Family Medicine

## 2024-03-14 DIAGNOSIS — E119 Type 2 diabetes mellitus without complications: Secondary | ICD-10-CM

## 2024-04-13 ENCOUNTER — Other Ambulatory Visit: Payer: Self-pay | Admitting: Family Medicine

## 2024-05-01 ENCOUNTER — Other Ambulatory Visit: Payer: Self-pay | Admitting: Family Medicine

## 2024-05-01 DIAGNOSIS — E559 Vitamin D deficiency, unspecified: Secondary | ICD-10-CM

## 2024-05-03 ENCOUNTER — Other Ambulatory Visit: Payer: Self-pay | Admitting: Family Medicine

## 2024-06-10 ENCOUNTER — Ambulatory Visit
Admission: RE | Admit: 2024-06-10 | Discharge: 2024-06-10 | Disposition: A | Source: Ambulatory Visit | Attending: Family Medicine | Admitting: Family Medicine

## 2024-06-10 ENCOUNTER — Ambulatory Visit: Admitting: Family Medicine

## 2024-06-10 ENCOUNTER — Ambulatory Visit: Payer: Self-pay

## 2024-06-10 ENCOUNTER — Encounter: Payer: Self-pay | Admitting: Family Medicine

## 2024-06-10 VITALS — BP 134/82 | HR 105 | Temp 98.0°F | Ht 61.0 in | Wt 188.0 lb

## 2024-06-10 DIAGNOSIS — R5383 Other fatigue: Secondary | ICD-10-CM | POA: Diagnosis not present

## 2024-06-10 DIAGNOSIS — E1159 Type 2 diabetes mellitus with other circulatory complications: Secondary | ICD-10-CM | POA: Diagnosis not present

## 2024-06-10 DIAGNOSIS — I152 Hypertension secondary to endocrine disorders: Secondary | ICD-10-CM | POA: Diagnosis not present

## 2024-06-10 DIAGNOSIS — Z7985 Long-term (current) use of injectable non-insulin antidiabetic drugs: Secondary | ICD-10-CM

## 2024-06-10 DIAGNOSIS — M5442 Lumbago with sciatica, left side: Secondary | ICD-10-CM

## 2024-06-10 MED ORDER — PREDNISONE 20 MG PO TABS: ORAL_TABLET | ORAL | 0 refills | Status: AC

## 2024-06-10 NOTE — Telephone Encounter (Signed)
" °  FYI Only or Action Required?: FYI only for provider: appointment scheduled on 1/27.  Patient was last seen in primary care on 09/20/2023 by Duanne Butler DASEN, MD.  Called Nurse Triage reporting Hip Pain.  Symptoms began several weeks ago.  Interventions attempted: OTC medications: icy hot rub, lidocaine patches, heating pad.  Symptoms are: gradually worsening.  Triage Disposition: See PCP When Office is Open (Within 3 Days)  Patient/caregiver understands and will follow disposition?: Yes    Reason for Triage: pt reports she is having pain that goes from left hip down to toes, with tingling, and feels weird.    Pt is requesting an appt for evaluation.  Reason for Disposition  [1] MODERATE pain (e.g., interferes with normal activities, limping) AND [2] present > 3 days  Answer Assessment - Initial Assessment Questions 1. LOCATION and RADIATION: Where is the pain located? Does the pain spread (shoot) anywhere else?     Left hip, runs down the left leg, behind the calf, down to the toe area.   2. QUALITY: What does the pain feel like?  (e.g., sharp, dull, aching, burning)     Dull ache that can also feel sharp with intermittent tingling more in like the foot and toe area.   3. SEVERITY: How bad is the pain? What does it keep you from doing?   (Scale 1-10; or mild, moderate, severe)    3/10 when laying down, worse with ambulation. Pt has been using lidocaine patches.   4. ONSET: When did the pain start? Does it come and go, or is it there all the time?     Few weeks  6. CAUSE: What do you think is causing the hip pain?      I had tweaked my back a few weeks ago, I don't know if that has anything to do with that.   7. AGGRAVATING FACTORS: What makes the hip pain worse? (e.g., walking, climbing stairs, running)     Standing up  8. OTHER SYMPTOMS: Do you have any other symptoms? (e.g., back pain, pain shooting down leg,  fever, rash)     Tingling to L  toes when she stands, reports she is still ambulatory, tingling improves the more she walks.  Protocols used: Hip Pain-A-AH  "

## 2024-06-10 NOTE — Progress Notes (Signed)
 "  Subjective:    Patient ID: [name], female    DOB: Feb 20, 1966, 59 y.o.   MRN: [id number]  Patient states that 2 months ago, she was bending over to pick up her child when she felt a pain in the center of her lower back.  For the last 2 months, the patient has a shooting pain radiating down her left leg into her left gluteus down her left posterior hip all the way into her left foot and left great toe.  It is a nervelike shooting pain.  It is extremely bad going from a lying position to a standing position or vice versa.  Once she is up and moving around the pain will improve slightly Past Medical History:  Diagnosis Date   Breast cancer (HCC) 12/2005   Right breast   Cancer (HCC) 02/2007   Renal cell carcinoma - Left kidney   Fibromyalgia    Hypertension    Insomnia    Ruptured disc, cervical    Past Surgical History:  Procedure Laterality Date   CERVICAL DISC SURGERY     CESAREAN SECTION     C-section x 3   KNEE ARTHROSCOPY Right 1998   MASTECTOMY Right 02/02/2006   PARTIAL NEPHRECTOMY Left 06/11/2006   PORT-A-CATH REMOVAL     SALPINGOOPHORECTOMY     Current Outpatient Medications on File Prior to Visit  Medication Sig Dispense Refill   cyclobenzaprine  (FLEXERIL ) 10 MG tablet TAKE 1 TABLET(10 MG) BY MOUTH THREE TIMES DAILY AS NEEDED FOR MUSCLE SPASMS 30 tablet 2   losartan -hydrochlorothiazide  (HYZAAR) 100-12.5 MG tablet TAKE 1 TABLET BY MOUTH DAILY 90 tablet 3   metFORMIN  (GLUCOPHAGE -XR) 500 MG 24 hr tablet TAKE 2 TABLETS(1000 MG) BY MOUTH DAILY WITH BREAKFAST 180 tablet 0   pioglitazone  (ACTOS ) 30 MG tablet TAKE 1 TABLET(30 MG) BY MOUTH DAILY 90 tablet 0   potassium chloride  SA (KLOR-CON  M) 20 MEQ tablet TAKE 2 TABLETS(40 MEQ) BY MOUTH DAILY 180 tablet 3   pravastatin  (PRAVACHOL ) 40 MG tablet TAKE 1 TABLET(40 MG) BY MOUTH DAILY 90 tablet 3   Semaglutide ,0.25 or 0.5MG /DOS, (OZEMPIC , 0.25 OR 0.5 MG/DOSE,) 2 MG/3ML SOPN Inject 0.25 mg into the skin once a week. 3 mL 3    zolpidem  (AMBIEN ) 10 MG tablet TAKE 1 TABLET BY MOUTH EVERY DAY AT BEDTIME AS NEEDED FOR SLEEP 30 tablet 3   No current facility-administered medications on file prior to visit.   Allergies  Allergen Reactions   Codeine Nausea And Vomiting   Social History   Socioeconomic History   Marital status: Married    Spouse name: Not on file   Number of children: Not on file   Years of education: Not on file   Highest education level: Not on file  Occupational History   Not on file  Tobacco Use   Smoking status: Former    Types: Cigarettes   Smokeless tobacco: Never   Tobacco comments:    Quit over 20 years ago  Substance and Sexual Activity   Alcohol use: No   Drug use: No   Sexual activity: Yes    Birth control/protection: Surgical  Other Topics Concern   Not on file  Social History Narrative   Not on file   Social Drivers of Health   Tobacco Use: Medium Risk (06/10/2024)   Patient History    Smoking Tobacco Use: Former    Smokeless Tobacco Use: Never    Passive Exposure: Not on file  Financial Resource Strain: Not  on file  Food Insecurity: Not on file  Transportation Needs: Not on file  Physical Activity: Not on file  Stress: Not on file  Social Connections: Not on file  Intimate Partner Violence: Not on file  Depression (PHQ2-9): Low Risk (09/20/2023)   Depression (PHQ2-9)    PHQ-2 Score: 0  Alcohol Screen: Not on file  Housing: Not on file  Utilities: Not on file  Health Literacy: Not on file     Review of Systems  All other systems reviewed and are negative.      Objective:   Physical Exam Vitals reviewed.  Constitutional:      Appearance: She is well-developed.  Neck:     Vascular: No carotid bruit.  Cardiovascular:     Rate and Rhythm: Normal rate and regular rhythm.     Heart sounds: Normal heart sounds. No murmur heard.    No friction rub. No gallop.  Pulmonary:     Effort: Pulmonary effort is normal. No respiratory distress.     Breath  sounds: Normal breath sounds. No wheezing or rales.  Abdominal:     General: Bowel sounds are normal.     Palpations: Abdomen is soft.  Musculoskeletal:     Cervical back: Neck supple. No rigidity.     Lumbar back: No spasms, tenderness or bony tenderness. Decreased range of motion.       Back:     Right lower leg: No edema.     Left lower leg: No edema.       Legs:  Lymphadenopathy:     Cervical: No cervical adenopathy.           Assessment & Plan:  Hypertension associated with type 2 diabetes mellitus (HCC) - Plan: Hemoglobin A1c, CBC with Differential/Platelet, Comprehensive metabolic panel with GFR, Lipid panel  Acute left-sided low back pain with left-sided sciatica - Plan: DG Lumbar Spine Complete  Other fatigue - Plan: TSH I suspect the patient has likely herniated a disc in her back.  Her symptoms are consistent with left-sided sciatica.  Proceed with an x-ray of the lumbar spine to evaluate if there is degenerative disc disease.  Meanwhile we will start the patient on a prednisone  taper pack to try to relieve nerve impingement.  Blood pressure today is acceptable.  While the patient is here I will check a CBC CMP lipid panel and her A1c as she has type 2 diabetes mellitus and is overdue for lab work.  Her blood pressure today is acceptable at 134/82.  The patient has endorsed some fatigue and she would like to check her TSH as well "

## 2024-06-11 LAB — CBC WITH DIFFERENTIAL/PLATELET
Absolute Lymphocytes: 2342 {cells}/uL (ref 850–3900)
Absolute Monocytes: 833 {cells}/uL (ref 200–950)
Basophils Absolute: 78 {cells}/uL (ref 0–200)
Basophils Relative: 0.8 %
Eosinophils Absolute: 196 {cells}/uL (ref 15–500)
Eosinophils Relative: 2 %
HCT: 44.8 % (ref 35.9–46.0)
Hemoglobin: 14.6 g/dL (ref 11.7–15.5)
MCH: 29.7 pg (ref 27.0–33.0)
MCHC: 32.6 g/dL (ref 31.6–35.4)
MCV: 91.2 fL (ref 81.4–101.7)
MPV: 10.2 fL (ref 7.5–12.5)
Monocytes Relative: 8.5 %
Neutro Abs: 6350 {cells}/uL (ref 1500–7800)
Neutrophils Relative %: 64.8 %
Platelets: 374 10*3/uL (ref 140–400)
RBC: 4.91 Million/uL (ref 3.80–5.10)
RDW: 12.8 % (ref 11.0–15.0)
Total Lymphocyte: 23.9 %
WBC: 9.8 10*3/uL (ref 3.8–10.8)

## 2024-06-11 LAB — COMPREHENSIVE METABOLIC PANEL WITH GFR
AG Ratio: 1.4 (calc) (ref 1.0–2.5)
ALT: 18 U/L (ref 6–29)
AST: 22 U/L (ref 10–35)
Albumin: 4.5 g/dL (ref 3.6–5.1)
Alkaline phosphatase (APISO): 90 U/L (ref 37–153)
BUN: 11 mg/dL (ref 7–25)
CO2: 21 mmol/L (ref 20–32)
Calcium: 9.7 mg/dL (ref 8.6–10.4)
Chloride: 105 mmol/L (ref 98–110)
Creat: 0.73 mg/dL (ref 0.50–1.03)
Globulin: 3.3 g/dL (ref 1.9–3.7)
Glucose, Bld: 100 mg/dL — ABNORMAL HIGH (ref 65–99)
Potassium: 4.1 mmol/L (ref 3.5–5.3)
Sodium: 141 mmol/L (ref 135–146)
Total Bilirubin: 0.6 mg/dL (ref 0.2–1.2)
Total Protein: 7.8 g/dL (ref 6.1–8.1)
eGFR: 95 mL/min/{1.73_m2}

## 2024-06-11 LAB — TSH: TSH: 7.12 m[IU]/L — ABNORMAL HIGH (ref 0.40–4.50)

## 2024-06-11 LAB — HEMOGLOBIN A1C
Hgb A1c MFr Bld: 6.1 % — ABNORMAL HIGH
Mean Plasma Glucose: 128 mg/dL
eAG (mmol/L): 7.1 mmol/L

## 2024-06-11 LAB — LIPID PANEL
Cholesterol: 166 mg/dL
HDL: 67 mg/dL
LDL Cholesterol (Calc): 80 mg/dL
Non-HDL Cholesterol (Calc): 99 mg/dL
Total CHOL/HDL Ratio: 2.5 (calc)
Triglycerides: 108 mg/dL

## 2024-06-12 ENCOUNTER — Ambulatory Visit: Payer: Self-pay | Admitting: Family Medicine

## 2024-06-13 ENCOUNTER — Other Ambulatory Visit: Payer: Self-pay | Admitting: Family Medicine

## 2024-06-13 DIAGNOSIS — E119 Type 2 diabetes mellitus without complications: Secondary | ICD-10-CM

## 2024-06-17 ENCOUNTER — Other Ambulatory Visit: Payer: Self-pay | Admitting: Family Medicine

## 2024-06-17 DIAGNOSIS — E119 Type 2 diabetes mellitus without complications: Secondary | ICD-10-CM
